# Patient Record
Sex: Male | Born: 1960 | Race: White | Hispanic: No | Marital: Single | Smoking: Never smoker
Health system: Southern US, Community
[De-identification: ages and names within clinical notes are randomized; demographics above are authoritative.]

## PROBLEM LIST (undated history)

## (undated) DIAGNOSIS — K635 Polyp of colon: Secondary | ICD-10-CM

## (undated) DIAGNOSIS — Z8719 Personal history of other diseases of the digestive system: Secondary | ICD-10-CM

## (undated) HISTORY — DX: Polyp of colon: K63.5

---
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
## 2005-11-13 ENCOUNTER — Encounter: Admission: RE | Admit: 2005-11-13 | Discharge: 2005-11-13 | Payer: Self-pay | Admitting: Family Medicine

## 2010-12-18 HISTORY — PX: COLON SURGERY: SHX602

## 2011-12-19 DIAGNOSIS — Z8719 Personal history of other diseases of the digestive system: Secondary | ICD-10-CM

## 2011-12-19 HISTORY — DX: Personal history of other diseases of the digestive system: Z87.19

## 2011-12-21 ENCOUNTER — Ambulatory Visit (INDEPENDENT_AMBULATORY_CARE_PROVIDER_SITE_OTHER): Payer: 59 | Admitting: General Surgery

## 2011-12-21 ENCOUNTER — Encounter (INDEPENDENT_AMBULATORY_CARE_PROVIDER_SITE_OTHER): Payer: Self-pay | Admitting: General Surgery

## 2011-12-21 VITALS — BP 134/78 | HR 72 | Temp 98.2°F | Resp 18 | Ht 76.0 in | Wt 356.6 lb

## 2011-12-21 DIAGNOSIS — K635 Polyp of colon: Secondary | ICD-10-CM

## 2011-12-21 DIAGNOSIS — D126 Benign neoplasm of colon, unspecified: Secondary | ICD-10-CM

## 2011-12-21 NOTE — Progress Notes (Signed)
Patient ID: Larry Mcclure, male   DOB: 03/24/1961, 51 y.o.   MRN: [id number]  Chief Complaint  Patient presents with  . Other    right colon polyp    HPI Larry Mcclure is a 51 y.o. male.  Referred by Dr. Doy Mince HPI 66 yom who underwent first screening colonoscopy this year with Dr. Bosie Clos.  He had no complaints prior to this.  He denies BRB, change in stool habits or caliber, ab pain or weight loss.  His colonoscopy showed a 30 mm polypoid, multi-lobulated lesion in proximal ascending colon.  It was injected with [place] ink.  The remainder of his colonoscopy showed a sessile polyp in the descending colon.  Pathology showed fragments of adenomatous colonic mucosa in the ascending colon as well as tubular adenoma in descending colon.  He is referred for removal of ascending colon polyp.  Past Medical History  Diagnosis Date  . Colon polyp     Past Surgical History  Procedure Date  . Tonsillectomy 1973    Family History  Problem Relation Age of Onset  . COPD Mother     Social History History  Substance Use Topics  . Smoking status: Never Smoker   . Smokeless tobacco: Never Used  . Alcohol Use: Yes     once per month    Allergies  Allergen Reactions  . Penicillins Rash    Happened in childhood    Current Outpatient Prescriptions  Medication Sig Dispense Refill  . Diphenhydramine-APAP, sleep, (GOODYS PM PO) Take by mouth as needed.          Review of Systems Review of Systems  Constitutional: Negative for fever, chills and unexpected weight change.  HENT: Negative for hearing loss, congestion, sore throat, trouble swallowing and voice change.   Eyes: Negative for visual disturbance.  Respiratory: Negative for cough and wheezing.   Cardiovascular: Negative for chest pain, palpitations and leg swelling.  Gastrointestinal: Negative for nausea, vomiting, abdominal pain, diarrhea, constipation, blood in stool, abdominal distention, anal bleeding and rectal pain.    Genitourinary: Negative for hematuria and difficulty urinating.  Musculoskeletal: Negative for arthralgias.  Skin: Negative for rash and wound.  Neurological: Negative for seizures, syncope, weakness and headaches.  Hematological: Negative for adenopathy. Does not bruise/bleed easily.  Psychiatric/Behavioral: Negative for confusion.    Blood pressure 134/78, pulse 72, temperature 98.2 F (36.8 C), temperature source Temporal, resp. rate 18, height 6\' 4"  (1.93 m), weight 356 lb 9.6 oz (161.753 kg).  Physical Exam Physical Exam  Constitutional: He appears well-developed and well-nourished.  Eyes: No scleral icterus.  Neck: Neck supple.  Cardiovascular: Normal rate, regular rhythm and normal heart sounds.   Pulmonary/Chest: Effort normal and breath sounds normal. He has no wheezes. He has no rales.  Abdominal: Soft. Bowel sounds are normal. He exhibits no mass. There is no tenderness.       Obese   Lymphadenopathy:    He has no cervical adenopathy.    Data Reviewed Colonoscopy and pathology report reviewed  Assessment    RIght colon adenomatous polyp unable to be excised endoscopically    Plan    We discussed pathology report and indications for surgery.  Indications include excising a precancerous lesion as well as ensuring there is no cancer present in larger polyp that remains.  We discussed a laparoscopic right colectomy.  We went over pathophysiology of colon polyps and colon cancer.  Risks of surgery include but are not limited to bleeding, infection, abscess requiring  drainage, anastomotic leakage requiring reoperation, DVT, PE, PNA, UTI.  Will plan on bowel prep preop, sq heparin, entereg perioperatively.  He would like to do this end of February and I think that is reasonable.  We discussed 2 months out of work given his job.  I will see him back a week before surgery to ensure no changes.        Larry Mcclure 12/21/2011, 9:30 PM

## 2011-12-26 ENCOUNTER — Encounter (INDEPENDENT_AMBULATORY_CARE_PROVIDER_SITE_OTHER): Payer: Self-pay | Admitting: Gastroenterology

## 2011-12-26 ENCOUNTER — Encounter (INDEPENDENT_AMBULATORY_CARE_PROVIDER_SITE_OTHER): Payer: Self-pay

## 2012-02-06 ENCOUNTER — Encounter (HOSPITAL_COMMUNITY): Payer: Self-pay | Admitting: Pharmacy Technician

## 2012-02-07 ENCOUNTER — Encounter (HOSPITAL_COMMUNITY)
Admission: RE | Admit: 2012-02-07 | Discharge: 2012-02-07 | Disposition: A | Discharge status: Home / Self Care | Payer: 59 | Source: Ambulatory Visit | Attending: General Surgery | Admitting: General Surgery

## 2012-02-07 ENCOUNTER — Other Ambulatory Visit: Payer: Self-pay

## 2012-02-07 ENCOUNTER — Ambulatory Visit (HOSPITAL_COMMUNITY)
Admission: RE | Admit: 2012-02-07 | Discharge: 2012-02-07 | Disposition: A | Discharge status: Home / Self Care | Payer: 59 | Source: Ambulatory Visit | Attending: General Surgery | Admitting: General Surgery

## 2012-02-07 ENCOUNTER — Encounter (HOSPITAL_COMMUNITY): Payer: Self-pay

## 2012-02-07 DIAGNOSIS — Z01818 Encounter for other preprocedural examination: Secondary | ICD-10-CM | POA: Insufficient documentation

## 2012-02-07 DIAGNOSIS — K639 Disease of intestine, unspecified: Secondary | ICD-10-CM | POA: Insufficient documentation

## 2012-02-07 DIAGNOSIS — Z01812 Encounter for preprocedural laboratory examination: Secondary | ICD-10-CM | POA: Insufficient documentation

## 2012-02-07 LAB — COMPREHENSIVE METABOLIC PANEL
ALT: 21 U/L (ref 0–53)
AST: 29 U/L (ref 0–37)
Albumin: 3.5 g/dL (ref 3.5–5.2)
CO2: 26 mEq/L (ref 19–32)
Chloride: 106 mEq/L (ref 96–112)
Creatinine, Ser: 1.04 mg/dL (ref 0.50–1.35)
GFR calc non Af Amer: 81 mL/min — ABNORMAL LOW (ref >90)
Sodium: 139 mEq/L (ref 135–145)
Total Bilirubin: 0.6 mg/dL (ref 0.3–1.2)

## 2012-02-07 LAB — CBC
Platelets: 163 10*3/uL (ref 150–400)
RBC: 5.23 MIL/uL (ref 4.22–5.81)
RDW: 14.3 % (ref 11.5–15.5)
WBC: 6.8 10*3/uL (ref 4.0–10.5)

## 2012-02-07 NOTE — Patient Instructions (Signed)
20 Larry Mcclure  02/07/2012   Your procedure is scheduled on: 02-14-12  Report to [facility] at     0630   AM.  Call this number if you have problems the morning of surgery: [phone]   Remember:   Do not eat food:After Midnight. Follow bowel prep instructions per office.    Take these medicines the morning of surgery with A SIP OF WATER: none.   Do not wear jewelry, make-up or nail polish.  Do not wear lotions, powders, or perfumes. You may wear deodorant.  Do not shave 48 hours prior to surgery.(may shave neck and face)  Do not bring valuables to the hospital.  Contacts, dentures or bridgework may not be worn into surgery.  Leave suitcase in the car. After surgery it may be brought to your room.  For patients admitted to the hospital, checkout time is 11:00 AM the day of discharge.   Patients discharged the day of surgery will not be allowed to drive home.  Name and phone number of your driver: spouse  Special Instructions: CHG Shower Use Special Wash: 1/2 bottle night before surgery and 1/2 bottle morning of surgery.(Avoid face)   Please read over the following fact sheets that you were given: MRSA Information, blood transfusion fact sheet.

## 2012-02-07 NOTE — Pre-Procedure Instructions (Signed)
02-07-12 EKG/ CXR done today.

## 2012-02-08 ENCOUNTER — Encounter (INDEPENDENT_AMBULATORY_CARE_PROVIDER_SITE_OTHER): Payer: 59 | Admitting: General Surgery

## 2012-02-12 ENCOUNTER — Ambulatory Visit (INDEPENDENT_AMBULATORY_CARE_PROVIDER_SITE_OTHER): Payer: 59 | Admitting: General Surgery

## 2012-02-12 ENCOUNTER — Encounter (INDEPENDENT_AMBULATORY_CARE_PROVIDER_SITE_OTHER): Payer: Self-pay | Admitting: General Surgery

## 2012-02-12 VITALS — BP 144/88 | HR 72 | Temp 97.8°F | Resp 16 | Ht 76.0 in | Wt 357.8 lb

## 2012-02-12 DIAGNOSIS — D126 Benign neoplasm of colon, unspecified: Secondary | ICD-10-CM

## 2012-02-12 DIAGNOSIS — K635 Polyp of colon: Secondary | ICD-10-CM

## 2012-02-12 MED ORDER — PEG 3350-KCL-NABCB-NACL-NASULF 236 G PO SOLR: 4.0000 L | Freq: Once | ORAL | Status: AC

## 2012-02-12 NOTE — Progress Notes (Signed)
Patient ID: Larry Mcclure, male   DOB: 1961/08/02, 52 y.o.   MRN: [id number]  Chief Complaint  Patient presents with  . Pre-op Exam    reck before t colectomy 02/14/12    HPI Larry Mcclure is a 51 y.o. male.   HPI This is a 51 year old male who I saw motor to ago after undergoing a screening colonoscopy with a proximal descending colon polyp. This is an adenomatous polyp. He was referred for surgery as this was not able to be resected endoscopically. He wanted to do this this week. I didn't come back now just to reexamine him prior to surgery. He reports no interval change .  Past Medical History  Diagnosis Date  . Colon polyp     Past Surgical History  Procedure Date  . Tonsillectomy 1973    Family History  Problem Relation Age of Onset  . COPD Mother   . Heart disease Mother   . Heart disease Father   . Cancer Sister     GI tumor    Social History History  Substance Use Topics  . Smoking status: Never Smoker   . Smokeless tobacco: Never Used  . Alcohol Use: Yes     once per month    Allergies  Allergen Reactions  . Penicillins Rash    Happened in childhood    Current Outpatient Prescriptions  Medication Sig Dispense Refill  . Aspirin-Acetaminophen-Caffeine (GOODYS EXTRA STRENGTH) 500-325-65 MG PACK Take 1 Package by mouth every 4 (four) hours as needed. Pain        Review of Systems Review of Systems  Constitutional: Negative for fever, chills and unexpected weight change.  HENT: Negative for hearing loss, congestion, sore throat, trouble swallowing and voice change.   Eyes: Negative for visual disturbance.  Respiratory: Negative for cough and wheezing.   Cardiovascular: Negative for chest pain, palpitations and leg swelling.  Gastrointestinal: Negative for nausea, vomiting, abdominal pain, diarrhea, constipation, blood in stool, abdominal distention, anal bleeding and rectal pain.  Genitourinary: Negative for hematuria and difficulty urinating.    Musculoskeletal: Negative for arthralgias.  Skin: Negative for rash and wound.  Neurological: Negative for seizures, syncope, weakness and headaches.  Hematological: Negative for adenopathy. Does not bruise/bleed easily.  Psychiatric/Behavioral: Negative for confusion.    Blood pressure 144/88, pulse 72, temperature 97.8 F (36.6 C), temperature source Temporal, resp. rate 16, height 6\' 4"  (1.93 m), weight 357 lb 12.8 oz (162.297 kg).  Physical Exam Physical Exam  Vitals reviewed. Constitutional: He appears well-developed and well-nourished.  Cardiovascular: Normal rate, regular rhythm and normal heart sounds.   Pulmonary/Chest: Effort normal and breath sounds normal. He has no wheezes. He has no rales.  Abdominal: Soft. Bowel sounds are normal. He exhibits no mass. There is no tenderness.      Assessment    Right colon polyp    Plan    We again discussed a laparoscopic possible open right colectomy. We discussed again the risks and benefits associated with surgery. We'll plan on proceeding Wednesday as scheduled.       Chloe Baig 02/12/2012, 10:21 AM

## 2012-02-14 ENCOUNTER — Encounter (HOSPITAL_COMMUNITY)
Admission: RE | Disposition: A | Discharge status: Home Health | Payer: Self-pay | Source: Ambulatory Visit | Attending: General Surgery

## 2012-02-14 ENCOUNTER — Inpatient Hospital Stay (HOSPITAL_COMMUNITY)
Admission: RE | Admit: 2012-02-14 | Discharge: 2012-02-18 | DRG: 330 | Disposition: A | Discharge status: Home Health | Payer: 59 | Source: Ambulatory Visit | Attending: General Surgery | Admitting: General Surgery

## 2012-02-14 ENCOUNTER — Encounter (HOSPITAL_COMMUNITY): Payer: Self-pay | Admitting: Anesthesiology

## 2012-02-14 ENCOUNTER — Encounter (HOSPITAL_COMMUNITY): Payer: Self-pay | Admitting: *Deleted

## 2012-02-14 ENCOUNTER — Ambulatory Visit (HOSPITAL_COMMUNITY): Payer: 59 | Admitting: Anesthesiology

## 2012-02-14 DIAGNOSIS — D378 Neoplasm of uncertain behavior of other specified digestive organs: Secondary | ICD-10-CM

## 2012-02-14 DIAGNOSIS — K6389 Other specified diseases of intestine: Secondary | ICD-10-CM

## 2012-02-14 DIAGNOSIS — D126 Benign neoplasm of colon, unspecified: Principal | ICD-10-CM | POA: Diagnosis present

## 2012-02-14 DIAGNOSIS — D371 Neoplasm of uncertain behavior of stomach: Secondary | ICD-10-CM

## 2012-02-14 DIAGNOSIS — K560 Paralytic ileus: Secondary | ICD-10-CM | POA: Diagnosis not present

## 2012-02-14 DIAGNOSIS — D375 Neoplasm of uncertain behavior of rectum: Secondary | ICD-10-CM

## 2012-02-14 LAB — TYPE AND SCREEN: Antibody Screen: NEGATIVE

## 2012-02-14 SURGERY — COLECTOMY, RIGHT, LAPAROSCOPIC
Anesthesia: General | Site: Abdomen | Laterality: Right | Wound class: Contaminated

## 2012-02-14 MED ORDER — MORPHINE SULFATE (PF) 1 MG/ML IV SOLN
INTRAVENOUS | Status: AC
  Filled 2012-02-14: qty 25

## 2012-02-14 MED ORDER — SODIUM CHLORIDE 0.9 % IV SOLN
INTRAVENOUS | Status: DC
  Administered 2012-02-14: 12:00:00 via INTRAVENOUS

## 2012-02-14 MED ORDER — NEOSTIGMINE METHYLSULFATE 1 MG/ML IJ SOLN
INTRAMUSCULAR | Status: DC | PRN
  Administered 2012-02-14: 4 mg via INTRAVENOUS

## 2012-02-14 MED ORDER — ROCURONIUM BROMIDE 100 MG/10ML IV SOLN
INTRAVENOUS | Status: DC | PRN
  Administered 2012-02-14: 50 mg via INTRAVENOUS
  Administered 2012-02-14 (×2): 10 mg via INTRAVENOUS
  Administered 2012-02-14: 20 mg via INTRAVENOUS
  Administered 2012-02-14 (×2): 10 mg via INTRAVENOUS

## 2012-02-14 MED ORDER — ACETAMINOPHEN 10 MG/ML IV SOLN
INTRAVENOUS | Status: DC | PRN
  Administered 2012-02-14: 1000 mg via INTRAVENOUS

## 2012-02-14 MED ORDER — PROMETHAZINE HCL 25 MG/ML IJ SOLN: 6.2500 mg | INTRAMUSCULAR | Status: DC | PRN

## 2012-02-14 MED ORDER — SUCCINYLCHOLINE CHLORIDE 20 MG/ML IJ SOLN
INTRAMUSCULAR | Status: DC | PRN
  Administered 2012-02-14: 200 mg via INTRAVENOUS

## 2012-02-14 MED ORDER — KETOROLAC TROMETHAMINE 30 MG/ML IJ SOLN
INTRAMUSCULAR | Status: AC
  Filled 2012-02-14: qty 1

## 2012-02-14 MED ORDER — SODIUM CHLORIDE 0.9 % IV SOLN
INTRAVENOUS | Status: AC
  Filled 2012-02-14: qty 1

## 2012-02-14 MED ORDER — EPINEPHRINE HCL 0.1 MG/ML IJ SOLN
INTRAMUSCULAR | Status: AC
  Filled 2012-02-14: qty 10

## 2012-02-14 MED ORDER — ALVIMOPAN 12 MG PO CAPS
12.0000 mg | ORAL_CAPSULE | Freq: Once | ORAL | Status: AC
  Administered 2012-02-14: 12 mg via ORAL

## 2012-02-14 MED ORDER — SODIUM CHLORIDE 0.9 % IV SOLN
INTRAVENOUS | Status: DC
  Administered 2012-02-14 – 2012-02-15 (×2): 110 mL via INTRAVENOUS

## 2012-02-14 MED ORDER — ONDANSETRON HCL 4 MG/2ML IJ SOLN: 4.0000 mg | Freq: Four times a day (QID) | INTRAMUSCULAR | Status: DC | PRN

## 2012-02-14 MED ORDER — ONDANSETRON HCL 4 MG/2ML IJ SOLN
INTRAMUSCULAR | Status: DC | PRN
  Administered 2012-02-14: 4 mg via INTRAVENOUS

## 2012-02-14 MED ORDER — PANTOPRAZOLE SODIUM 40 MG IV SOLR
40.0000 mg | Freq: Every day | INTRAVENOUS | Status: DC
  Administered 2012-02-14 – 2012-02-16 (×3): 40 mg via INTRAVENOUS
  Filled 2012-02-14 (×5): qty 40

## 2012-02-14 MED ORDER — NALOXONE HCL 0.4 MG/ML IJ SOLN: 0.4000 mg | INTRAMUSCULAR | Status: DC | PRN

## 2012-02-14 MED ORDER — LACTATED RINGERS IR SOLN
Status: DC | PRN
  Administered 2012-02-14: 1000 mL

## 2012-02-14 MED ORDER — LIDOCAINE HCL (CARDIAC) 20 MG/ML IV SOLN
INTRAVENOUS | Status: DC | PRN
  Administered 2012-02-14: 50 mg via INTRAVENOUS

## 2012-02-14 MED ORDER — HYDROMORPHONE HCL PF 1 MG/ML IJ SOLN: 0.2500 mg | INTRAMUSCULAR | Status: DC | PRN

## 2012-02-14 MED ORDER — HYDROMORPHONE HCL PF 1 MG/ML IJ SOLN
INTRAMUSCULAR | Status: DC | PRN
  Administered 2012-02-14: 1 mg via INTRAVENOUS
  Administered 2012-02-14 (×2): 0.5 mg via INTRAVENOUS

## 2012-02-14 MED ORDER — ACETAMINOPHEN 650 MG RE SUPP
650.0000 mg | Freq: Four times a day (QID) | RECTAL | Status: DC | PRN
  Administered 2012-02-15: 650 mg via RECTAL
  Filled 2012-02-14: qty 1

## 2012-02-14 MED ORDER — MIDAZOLAM HCL 5 MG/5ML IJ SOLN
INTRAMUSCULAR | Status: DC | PRN
  Administered 2012-02-14: 2 mg via INTRAVENOUS

## 2012-02-14 MED ORDER — SODIUM CHLORIDE 0.9 % IJ SOLN: 9.0000 mL | INTRAMUSCULAR | Status: DC | PRN

## 2012-02-14 MED ORDER — DIPHENHYDRAMINE HCL 12.5 MG/5ML PO ELIX: 12.5000 mg | ORAL_SOLUTION | Freq: Four times a day (QID) | ORAL | Status: DC | PRN

## 2012-02-14 MED ORDER — HEPARIN SODIUM (PORCINE) 5000 UNIT/ML IJ SOLN
5000.0000 [IU] | Freq: Once | INTRAMUSCULAR | Status: AC
  Administered 2012-02-14: 5000 [IU] via SUBCUTANEOUS

## 2012-02-14 MED ORDER — ACETAMINOPHEN 325 MG PO TABS: 650.0000 mg | ORAL_TABLET | Freq: Four times a day (QID) | ORAL | Status: DC | PRN

## 2012-02-14 MED ORDER — ALVIMOPAN 12 MG PO CAPS: 12.0000 mg | ORAL_CAPSULE | Freq: Once | ORAL | Status: DC

## 2012-02-14 MED ORDER — BUPIVACAINE HCL (PF) 0.25 % IJ SOLN
INTRAMUSCULAR | Status: AC
  Filled 2012-02-14: qty 30

## 2012-02-14 MED ORDER — FENTANYL CITRATE 0.05 MG/ML IJ SOLN
INTRAMUSCULAR | Status: DC | PRN
  Administered 2012-02-14 (×2): 50 ug via INTRAVENOUS
  Administered 2012-02-14: 100 ug via INTRAVENOUS
  Administered 2012-02-14 (×6): 50 ug via INTRAVENOUS

## 2012-02-14 MED ORDER — PROPOFOL 10 MG/ML IV BOLUS
INTRAVENOUS | Status: DC | PRN
  Administered 2012-02-14: 200 mg via INTRAVENOUS

## 2012-02-14 MED ORDER — LACTATED RINGERS IV SOLN
INTRAVENOUS | Status: DC
  Administered 2012-02-14: 09:00:00 via INTRAVENOUS
  Administered 2012-02-14: 1000 mL via INTRAVENOUS

## 2012-02-14 MED ORDER — DIPHENHYDRAMINE HCL 50 MG/ML IJ SOLN: 12.5000 mg | Freq: Four times a day (QID) | INTRAMUSCULAR | Status: DC | PRN

## 2012-02-14 MED ORDER — KETOROLAC TROMETHAMINE 30 MG/ML IJ SOLN
15.0000 mg | Freq: Once | INTRAMUSCULAR | Status: AC | PRN
  Administered 2012-02-14: 30 mg via INTRAVENOUS

## 2012-02-14 MED ORDER — HEPARIN SODIUM (PORCINE) 5000 UNIT/ML IJ SOLN
5000.0000 [IU] | Freq: Three times a day (TID) | INTRAMUSCULAR | Status: DC
  Administered 2012-02-15 – 2012-02-18 (×10): 5000 [IU] via SUBCUTANEOUS
  Filled 2012-02-14 (×13): qty 1

## 2012-02-14 MED ORDER — 0.9 % SODIUM CHLORIDE (POUR BTL) OPTIME
TOPICAL | Status: DC | PRN
  Administered 2012-02-14: 2000 mL

## 2012-02-14 MED ORDER — SODIUM CHLORIDE 0.9 % IV SOLN
1.0000 g | INTRAVENOUS | Status: AC
  Administered 2012-02-14: 1 g via INTRAVENOUS

## 2012-02-14 MED ORDER — BUPIVACAINE HCL (PF) 0.25 % IJ SOLN
INTRAMUSCULAR | Status: DC | PRN
  Administered 2012-02-14: 20 mL

## 2012-02-14 MED ORDER — STERILE WATER FOR IRRIGATION IR SOLN
Status: DC | PRN
  Administered 2012-02-14: 1500 mL

## 2012-02-14 MED ORDER — MORPHINE SULFATE (PF) 1 MG/ML IV SOLN
INTRAVENOUS | Status: DC
  Administered 2012-02-14: 1.5 mg via INTRAVENOUS
  Administered 2012-02-14: 1 mg via INTRAVENOUS
  Administered 2012-02-14: 9 mg via INTRAVENOUS
  Administered 2012-02-15: 1.5 mg via INTRAVENOUS
  Administered 2012-02-15: 6 mg via INTRAVENOUS
  Administered 2012-02-15: 3 mg via INTRAVENOUS
  Administered 2012-02-15: 9 mg via INTRAVENOUS
  Administered 2012-02-15: 13.5 mg via INTRAVENOUS
  Administered 2012-02-15: 3 mg via INTRAVENOUS
  Administered 2012-02-15: 01:00:00 via INTRAVENOUS
  Administered 2012-02-16: 1.5 mg via INTRAVENOUS
  Administered 2012-02-16: 3 mg via INTRAVENOUS
  Filled 2012-02-14 (×2): qty 25

## 2012-02-14 MED ORDER — GLYCOPYRROLATE 0.2 MG/ML IJ SOLN
INTRAMUSCULAR | Status: DC | PRN
  Administered 2012-02-14: .7 mg via INTRAVENOUS

## 2012-02-14 MED ORDER — BUPIVACAINE-EPINEPHRINE PF 0.25-1:200000 % IJ SOLN
INTRAMUSCULAR | Status: AC
  Filled 2012-02-14: qty 30

## 2012-02-14 MED ORDER — ACETAMINOPHEN 10 MG/ML IV SOLN
INTRAVENOUS | Status: AC
  Filled 2012-02-14: qty 100

## 2012-02-14 SURGICAL SUPPLY — 80 items
ADH SKN CLS APL DERMABOND .7 (GAUZE/BANDAGES/DRESSINGS)
APPLIER CLIP 5 13 M/L LIGAMAX5 (MISCELLANEOUS)
APPLIER CLIP ROT 10 11.4 M/L (STAPLE)
APR CLP MED LRG 11.4X10 (STAPLE)
APR CLP MED LRG 5 ANG JAW (MISCELLANEOUS)
BAG SPEC RTRVL LRG 6X4 10 (ENDOMECHANICALS)
BANDAID FLEXIBLE 1X3 (GAUZE/BANDAGES/DRESSINGS) ×2 IMPLANT
BLADE EXTENDED COATED 6.5IN (ELECTRODE) ×2 IMPLANT
BLADE HEX COATED 2.75 (ELECTRODE) ×3 IMPLANT
BLADE SURG SZ10 CARB STEEL (BLADE) ×6 IMPLANT
CANISTER SUCTION 2500CC (MISCELLANEOUS) ×3 IMPLANT
CANNULA ENDOPATH XCEL 11M (ENDOMECHANICALS) IMPLANT
CELLS DAT CNTRL 66122 CELL SVR (MISCELLANEOUS) IMPLANT
CHLORAPREP W/TINT 26ML (MISCELLANEOUS) ×3 IMPLANT
CLIP APPLIE 5 13 M/L LIGAMAX5 (MISCELLANEOUS) IMPLANT
CLIP APPLIE ROT 10 11.4 M/L (STAPLE) IMPLANT
CLOTH BEACON ORANGE TIMEOUT ST (SAFETY) ×3 IMPLANT
COVER MAYO STAND STRL (DRAPES) ×3 IMPLANT
DECANTER SPIKE VIAL GLASS SM (MISCELLANEOUS) ×3 IMPLANT
DERMABOND ADVANCED (GAUZE/BANDAGES/DRESSINGS)
DERMABOND ADVANCED .7 DNX12 (GAUZE/BANDAGES/DRESSINGS) IMPLANT
DRAPE LAPAROSCOPIC ABDOMINAL (DRAPES) ×3 IMPLANT
DRAPE LG THREE QUARTER DISP (DRAPES) IMPLANT
DRAPE WARM FLUID 44X44 (DRAPE) ×6 IMPLANT
DRESSING TELFA 8X3 (GAUZE/BANDAGES/DRESSINGS) ×2 IMPLANT
DRESSING TELFA ISLAND 4X8 (GAUZE/BANDAGES/DRESSINGS) ×2 IMPLANT
ELECT REM PT RETURN 9FT ADLT (ELECTROSURGICAL) ×3
ELECTRODE REM PT RTRN 9FT ADLT (ELECTROSURGICAL) ×2 IMPLANT
FILTER SMOKE EVAC LAPAROSHD (FILTER) IMPLANT
GLOVE BIOGEL M 7.0 STRL (GLOVE) ×6 IMPLANT
GLOVE BIOGEL PI IND STRL 7.0 (GLOVE) ×2 IMPLANT
GLOVE BIOGEL PI INDICATOR 7.0 (GLOVE) ×1
GOWN PREVENTION PLUS LG XLONG (DISPOSABLE) ×3 IMPLANT
GOWN PREVENTION PLUS XLARGE (GOWN DISPOSABLE) ×3 IMPLANT
GOWN STRL NON-REIN LRG LVL3 (GOWN DISPOSABLE) ×3 IMPLANT
GOWN STRL REIN XL XLG (GOWN DISPOSABLE) ×3 IMPLANT
KIT BASIN OR (CUSTOM PROCEDURE TRAY) ×3 IMPLANT
LEGGING LITHOTOMY PAIR STRL (DRAPES) IMPLANT
LIGASURE IMPACT 36 18CM CVD LR (INSTRUMENTS) ×3 IMPLANT
NS IRRIG 1000ML POUR BTL (IV SOLUTION) ×3 IMPLANT
PENCIL BUTTON HOLSTER BLD 10FT (ELECTRODE) ×3 IMPLANT
POUCH SPECIMEN RETRIEVAL 10MM (ENDOMECHANICALS) IMPLANT
RELOAD PROXIMATE 75MM BLUE (ENDOMECHANICALS) ×9 IMPLANT
RELOAD STAPLE 75 3.8 BLU REG (ENDOMECHANICALS) IMPLANT
RETRACTOR WND ALEXIS 18 MED (MISCELLANEOUS) IMPLANT
RTRCTR WOUND ALEXIS 18CM MED (MISCELLANEOUS)
SCALPEL HARMONIC ACE (MISCELLANEOUS) IMPLANT
SET IRRIG TUBING LAPAROSCOPIC (IRRIGATION / IRRIGATOR) ×2 IMPLANT
SLEEVE XCEL OPT CAN 5 100 (ENDOMECHANICALS) ×2 IMPLANT
SOLUTION ANTI FOG 6CC (MISCELLANEOUS) ×3 IMPLANT
SPONGE GAUZE 4X4 12PLY (GAUZE/BANDAGES/DRESSINGS) ×3 IMPLANT
SPONGE LAP 18X18 X RAY DECT (DISPOSABLE) ×6 IMPLANT
STAPLER GUN LINEAR PROX 60 (STAPLE) ×2 IMPLANT
STAPLER PROXIMATE 75MM BLUE (STAPLE) ×2 IMPLANT
STAPLER VISISTAT 35W (STAPLE) ×3 IMPLANT
STRIP CLOSURE SKIN 1/2X4 (GAUZE/BANDAGES/DRESSINGS) IMPLANT
SUCTION POOLE TIP (SUCTIONS) ×3 IMPLANT
SUT PDS AB 1 TP1 96 (SUTURE) ×6 IMPLANT
SUT PROLENE 2 0 KS (SUTURE) IMPLANT
SUT PROLENE 2 0 SH DA (SUTURE) IMPLANT
SUT SILK 2 0 (SUTURE) ×3
SUT SILK 2 0 SH CR/8 (SUTURE) ×2 IMPLANT
SUT SILK 2-0 18XBRD TIE 12 (SUTURE) ×2 IMPLANT
SUT SILK 3 0 (SUTURE) ×3
SUT SILK 3 0 SH CR/8 (SUTURE) ×2 IMPLANT
SUT SILK 3-0 18XBRD TIE 12 (SUTURE) ×1 IMPLANT
SYR BULB IRRIGATION 50ML (SYRINGE) ×3 IMPLANT
SYS LAPSCP GELPORT 120MM (MISCELLANEOUS)
SYSTEM LAPSCP GELPORT 120MM (MISCELLANEOUS) IMPLANT
TOWEL OR 17X26 10 PK STRL BLUE (TOWEL DISPOSABLE) ×3 IMPLANT
TRAY FOLEY CATH 14FRSI W/METER (CATHETERS) ×3 IMPLANT
TRAY LAP CHOLE (CUSTOM PROCEDURE TRAY) ×3 IMPLANT
TROCAR BLADELESS OPT 5 100 (ENDOMECHANICALS) ×2 IMPLANT
TROCAR BLADELESS OPT 5 75 (ENDOMECHANICALS) ×6 IMPLANT
TROCAR XCEL 12X100 BLDLESS (ENDOMECHANICALS) IMPLANT
TROCAR XCEL BLUNT TIP 100MML (ENDOMECHANICALS) IMPLANT
TROCAR XCEL NON-BLD 11X100MML (ENDOMECHANICALS) IMPLANT
TUBING FILTER THERMOFLATOR (ELECTROSURGICAL) ×3 IMPLANT
YANKAUER SUCT BULB TIP 10FT TU (MISCELLANEOUS) ×3 IMPLANT
YANKAUER SUCT BULB TIP NO VENT (SUCTIONS) ×3 IMPLANT

## 2012-02-14 NOTE — Progress Notes (Signed)
Pt did bowel prep  As instructed with good results. Pt has a stopped up nose with nasal drainage this am. Temp 99.2 Lungs clear

## 2012-02-14 NOTE — Anesthesia Postprocedure Evaluation (Signed)
  Anesthesia Post-op Note  Patient: Larry Mcclure  Procedure(s) Performed: Procedure(s) (LRB): LAPAROSCOPIC RIGHT COLECTOMY (Right)  Patient Location: PACU  Anesthesia Type: General  Level of Consciousness: awake and alert   Airway and Oxygen Therapy: Patient Spontanous Breathing  Post-op Pain: mild  Post-op Assessment: Post-op Vital signs reviewed, Patient's Cardiovascular Status Stable, Respiratory Function Stable, Patent Airway and No signs of Nausea or vomiting  Post-op Vital Signs: stable  Complications: No apparent anesthesia complications

## 2012-02-14 NOTE — Op Note (Signed)
Preoperative diagnosis: Right colon polyp unresectable by colonoscopy Postoperative diagnosis: Same as above Procedure: Laparoscopic-assisted right colectomy Surgeon: Dr. Harden Mo Asst.: Dr. Chevis Pretty Anesthesia: Gen. Estimated blood loss: 100 cc Drains: None Complications: None Specimens: #1 right colon which is confirmed by pathology to contain the polyp #2 additional small bowel  Sponge needle count correct x2 Disposition patient to recovery in stable condition  Indications: This is a 51 year old male who underwent routine screening colonoscopy with the finding of a 30 mm polypoid lesion in his right colon. This was biopsied and was benign. This was tattooed. This was not able to be resected endoscopically and he was referred for excision. He and I discussed a right hemicolectomy with the risks and benefits associated with that. He wanted to delay this for a couple of months due to work.  Procedure: He underwent a bowel preparation at home. He was then brought to the operating room. He was administered 1 g of intravenous ertapenem. Sequential compression devices were placed on his legs. He was administered 5000 units of subcutaneous heparin. He was then taken to the operating room and placed under general anesthesia without complication. An orogastric tube and Foley catheter were placed. His right arm was tucked appropriately padded. His left arm was left out. His abdomen was prepped and draped in the standard sterile surgical fashion. Surgical timeout was then performed.  The stomach was evacuated. I then infiltrated .25 percent Marcaine in his left upper quadrant. I made an incision with an 11 blade. I then entered his peritoneum with a 5 mm Optiview trocar. This was done without injury and without any difficulty. His abdomen was insufflated to 15 mmHg pressure. I then inserted 3 further 5 mm trocars under direct vision after infiltration with local anesthetic without complication in  the right lower quadrant and the lower abdomen. It was very difficult initially to identify his colon as he had a large amount of intra-abdominal fat. Eventually I was able to identify the colon as well as his appendix. The terminal ileum was noted to be very adherent to his sidewall. I identified his tattoo very near cecum. I then incised the white line of Toldt with the harmonic scalpel in its entirety and medialize the colon all the way up to the hepatic flexure. I also incised the attachments of the terminal ileum to the pelvic sidewall as well as release the appendix. This appeared to be free upon completion and rolled medially very easily. I then approached the transverse colon. His gallbladder had a lot of adhesions to his colon. It took me about 45 minutes to release the transverse colon from the gallbladder and completely free up the hepatic flexure. This again was done with a harmonic scalpel. The duodenum was visualized and was not injured during this procedure either. Eventually I thought I had this up enough to pull out through extraction port. I then made an 8 cm incision due to his body habitus and placed a hand port. It appeared at this point I was able to pull the entire colon up. His terminal ileum still appeared to be stuck I could not identify where exactly this was. The end of his terminal ileum after I divided with the GIA was a little bit bruised as well. I did divide this with a GIA. I then came across his colonic mesentery with the LigaSure device using 2-0 silk sutures to oversew all of the vessels. I chose a point in his transverse colon just past the  hepatic flexure and divided the colon. This was then passed off the table as a specimen. Due to the fact that I was really not able to pull the ileum up without some tension and I was concerned about the appearance of this bowel I did extend my incision a little bit below his umbilicus. I was able to then released completely his terminal  ileum and I used the GIA stapler to resect about another 6 cm segment of small bowel. The remaining small bowel was not tender at all it appeared very healthy. I then attached this to the transverse colon and under no tension with 3-0 silk sutures. Enterotomies were made in both Endo GIA stapler was used to create a common enterotomy. This was hemostatic. I then used a TA stapler to close the common enterotomy. I oversewed this with 3-0 silk. I placed 2 3-0 silk crotch stitches as well. The anastomosis was without tension and healthy. I did not close the mesenteric defect as it was very wide and was very difficult to do due to the amount of fat that he had present. Hemostasis was present. I then placed on his back in the abdomen. These trocars had been removed. I closed its fascia with #1 looped PDS. I then irrigated this wound copiously. I stapled it  over Telfa wicks. I stapled his laparoscopic incisions. Sterile dressings were placed on all of these. He tolerated this well was transferred to recovery in stable condition.

## 2012-02-14 NOTE — Transfer of Care (Signed)
Immediate Anesthesia Transfer of Care Note  Patient: Larry Mcclure  Procedure(s) Performed: Procedure(s) (LRB): LAPAROSCOPIC RIGHT COLECTOMY (Right)  Patient Location: PACU  Anesthesia Type: General  Level of Consciousness: sedated, patient cooperative and responds to stimulaton  Airway & Oxygen Therapy: Patient Spontanous Breathing and Patient connected to face mask oxgen  Post-op Assessment: Report given to PACU RN and Post -op Vital signs reviewed and stable  Post vital signs: Reviewed and stable  Complications: No apparent anesthesia complications

## 2012-02-14 NOTE — H&P (View-Only) (Signed)
Patient ID: Larry Mcclure, male   DOB: 03/30/1961, 51 y.o.   MRN: [id number]  Chief Complaint  Patient presents with  . Pre-op Exam    reck before t colectomy 02/14/12    HPI Larry Mcclure is a 51 y.o. male.   HPI This is a 51-year-old male who I saw motor to ago after undergoing a screening colonoscopy with a proximal descending colon polyp. This is an adenomatous polyp. He was referred for surgery as this was not able to be resected endoscopically. He wanted to do this this week. I didn't come back now just to reexamine him prior to surgery. He reports no interval change .  Past Medical History  Diagnosis Date  . Colon polyp     Past Surgical History  Procedure Date  . Tonsillectomy 1973    Family History  Problem Relation Age of Onset  . COPD Mother   . Heart disease Mother   . Heart disease Father   . Cancer Sister     GI tumor    Social History History  Substance Use Topics  . Smoking status: Never Smoker   . Smokeless tobacco: Never Used  . Alcohol Use: Yes     once per month    Allergies  Allergen Reactions  . Penicillins Rash    Happened in childhood    Current Outpatient Prescriptions  Medication Sig Dispense Refill  . Aspirin-Acetaminophen-Caffeine (GOODYS EXTRA STRENGTH) 500-325-65 MG PACK Take 1 Package by mouth every 4 (four) hours as needed. Pain        Review of Systems Review of Systems  Constitutional: Negative for fever, chills and unexpected weight change.  HENT: Negative for hearing loss, congestion, sore throat, trouble swallowing and voice change.   Eyes: Negative for visual disturbance.  Respiratory: Negative for cough and wheezing.   Cardiovascular: Negative for chest pain, palpitations and leg swelling.  Gastrointestinal: Negative for nausea, vomiting, abdominal pain, diarrhea, constipation, blood in stool, abdominal distention, anal bleeding and rectal pain.  Genitourinary: Negative for hematuria and difficulty urinating.    Musculoskeletal: Negative for arthralgias.  Skin: Negative for rash and wound.  Neurological: Negative for seizures, syncope, weakness and headaches.  Hematological: Negative for adenopathy. Does not bruise/bleed easily.  Psychiatric/Behavioral: Negative for confusion.    Blood pressure 144/88, pulse 72, temperature 97.8 F (36.6 C), temperature source Temporal, resp. rate 16, height 6' 4" (1.93 m), weight 357 lb 12.8 oz (162.297 kg).  Physical Exam Physical Exam  Vitals reviewed. Constitutional: He appears well-developed and well-nourished.  Cardiovascular: Normal rate, regular rhythm and normal heart sounds.   Pulmonary/Chest: Effort normal and breath sounds normal. He has no wheezes. He has no rales.  Abdominal: Soft. Bowel sounds are normal. He exhibits no mass. There is no tenderness.      Assessment    Right colon polyp    Plan    We again discussed a laparoscopic possible open right colectomy. We discussed again the risks and benefits associated with surgery. We'll plan on proceeding Wednesday as scheduled.       Larry Mcclure 02/12/2012, 10:21 AM    

## 2012-02-14 NOTE — Interval H&P Note (Signed)
History and Physical Interval Note:  02/14/2012 8:05 AM  Larry Mcclure  has presented today for surgery, with the diagnosis of colon mass  The various methods of treatment have been discussed with the patient and family. After consideration of risks, benefits and other options for treatment, the patient has consented to  Procedure(s) (LRB): LAPAROSCOPIC PARTIAL COLECTOMY (Right) as a surgical intervention .  The patients' history has been reviewed, patient examined, no change in status, stable for surgery.  I have reviewed the patients' chart and labs.  Questions were answered to the patient's satisfaction.     [name]

## 2012-02-14 NOTE — Anesthesia Preprocedure Evaluation (Addendum)
Anesthesia Evaluation  Patient identified by MRN, date of birth, ID band Patient awake    Reviewed: Allergy & Precautions, H&P , NPO status , Patient's Chart, lab work & pertinent test results  Airway Mallampati: II TM Distance: >3 FB Neck ROM: Full    Dental No notable dental hx.    Pulmonary  Probable OSA clear to auscultation  Pulmonary exam normal       Cardiovascular neg cardio ROS Regular Normal    Neuro/Psych Negative Neurological ROS  Negative Psych ROS   GI/Hepatic negative GI ROS, Neg liver ROS,   Endo/Other  Morbid obesity  Renal/GU negative Renal ROS  Genitourinary negative   Musculoskeletal negative musculoskeletal ROS (+)   Abdominal   Peds negative pediatric ROS (+)  Hematology negative hematology ROS (+)   Anesthesia Other Findings   Reproductive/Obstetrics negative OB ROS                          Anesthesia Physical Anesthesia Plan  ASA: III  Anesthesia Plan: General   Post-op Pain Management:    Induction: Intravenous  Airway Management Planned: Oral ETT  Additional Equipment:   Intra-op Plan:   Post-operative Plan: Extubation in OR  Informed Consent: I have reviewed the patients History and Physical, chart, labs and discussed the procedure including the risks, benefits and alternatives for the proposed anesthesia with the patient or authorized representative who has indicated his/her understanding and acceptance.   Dental advisory given  Plan Discussed with: CRNA  Anesthesia Plan Comments:         Anesthesia Quick Evaluation

## 2012-02-15 LAB — CBC
MCH: 26.7 pg (ref 26.0–34.0)
MCV: 84.1 fL (ref 78.0–100.0)
Platelets: 144 10*3/uL — ABNORMAL LOW (ref 150–400)
RDW: 14.3 % (ref 11.5–15.5)

## 2012-02-15 LAB — BASIC METABOLIC PANEL
CO2: 28 mEq/L (ref 19–32)
Calcium: 8 mg/dL — ABNORMAL LOW (ref 8.4–10.5)
Creatinine, Ser: 1.1 mg/dL (ref 0.50–1.35)
Glucose, Bld: 109 mg/dL — ABNORMAL HIGH (ref 70–99)

## 2012-02-15 MED ORDER — CHLORHEXIDINE GLUCONATE 0.12 % MT SOLN
15.0000 mL | Freq: Two times a day (BID) | OROMUCOSAL | Status: DC
  Administered 2012-02-15 – 2012-02-18 (×5): 15 mL via OROMUCOSAL
  Filled 2012-02-15 (×8): qty 15

## 2012-02-15 MED ORDER — BIOTENE DRY MOUTH MT LIQD
15.0000 mL | Freq: Two times a day (BID) | OROMUCOSAL | Status: DC
  Administered 2012-02-15 – 2012-02-17 (×4): 15 mL via OROMUCOSAL

## 2012-02-15 MED ORDER — DEXTROSE-NACL 5-0.45 % IV SOLN
INTRAVENOUS | Status: DC
  Administered 2012-02-15 – 2012-02-16 (×4): via INTRAVENOUS
  Filled 2012-02-15: qty 1000

## 2012-02-15 NOTE — Progress Notes (Signed)
1 Day Post-Op  Subjective: No nausea/vomiting, no flatus yet, ambulating, pain controlled  Objective: Vital signs in last 24 hours: Temp:  [97.4 F (36.3 C)-99 F (37.2 C)] 97.4 F (36.3 C) (02/28 0536) Pulse Rate:  [73-86] 83  (02/28 0536) Resp:  [13-21] 18  (02/28 0536) BP: (117-156)/(64-84) 127/69 mmHg (02/28 0536) SpO2:  [95 %-100 %] 99 % (02/28 0536) Weight:  [357 lb 12.8 oz (162.297 kg)] 357 lb 12.8 oz (162.297 kg) (02/27 1515) Last BM Date: 02/14/12  Intake/Output from previous day: 02/27 0701 - 02/28 0700 In: 3200 [I.V.:3200] Out: 1235 [Urine:1035; Blood:200] Intake/Output this shift:    General appearance: no distress Resp: clear to auscultation bilaterally Cardio: regular rate and rhythm, S1, S2 normal, no murmur, click, rub or gallop GI: dressing with some drainage, no real bs, approp tender  Lab Results:   [facility] 02/15/12 0420  WBC 9.2  HGB 12.4*  HCT 39.1  PLT 144*   BMET  Basename 02/15/12 0420  NA 135  K 3.8  CL 102  CO2 28  GLUCOSE 109*  BUN 21  CREATININE 1.10  CALCIUM 8.0*   Assessment/Plan: POD #1 lap assisted right colectomy 1. Neuro- cont pca for today, cont entereg 2. CV/Pulm- aggressive pulm toilet 3. GI- sips of clears, ice chips, await ileus to resolve 4. Renal- dc foley, cr stable 5. Heme- hct ok, will recheck platelets in 48 hours again 6. Heparin/scds   LOS: 1 day    [facility] 02/15/2012

## 2012-02-16 NOTE — Progress Notes (Signed)
2 Days Post-Op  Subjective: No nausea/vomiting, no flatus, ambulating  Objective: Vital signs in last 24 hours: Temp:  [97.6 F (36.4 C)-100.4 F (38 C)] 97.6 F (36.4 C) (03/01 0600) Pulse Rate:  [75-90] 78  (03/01 0600) Resp:  [15-18] 16  (03/01 0600) BP: (110-135)/(62-78) 110/69 mmHg (03/01 0600) SpO2:  [97 %-99 %] 99 % (03/01 0600) Last BM Date: 02/14/12  Intake/Output from previous day: 02/28 0701 - 03/01 0700 In: 1570 [P.O.:360; I.V.:1210] Out: 2630 [Urine:2630] Intake/Output this shift: Total I/O In: 1210 [I.V.:1210] Out: 1600 [Urine:1600]  General appearance: no distress Resp: clear to auscultation bilaterally Cardio: RRR GI: soft, appropr tender, bs present, wound with expected drainage wicks in place, no infection  Lab Results:   [facility] 02/15/12 0420  WBC 9.2  HGB 12.4*  HCT 39.1  PLT 144*   BMET  Basename 02/15/12 0420  NA 135  K 3.8  CL 102  CO2 28  GLUCOSE 109*  BUN 21  CREATININE 1.10  CALCIUM 8.0*    Assessment/Plan: POD #2 lap assist right colectomy 1. Neuro- cont pca as needed 2. CV/Pulm- continue aggressive pulmonary toilet 3. GI- no flatus, ileus appears to be resolving, will let have clears today, cont entereg 4. Renal- good UOP, check labs in am 5. Path pending 6. Scds, sq heparin  LOS: 2 days    [facility] 02/16/2012

## 2012-02-17 LAB — BASIC METABOLIC PANEL
BUN: 11 mg/dL (ref 6–23)
Calcium: 9.1 mg/dL (ref 8.4–10.5)
Chloride: 98 mEq/L (ref 96–112)
Creatinine, Ser: 0.94 mg/dL (ref 0.50–1.35)
GFR calc Af Amer: >90 mL/min (ref >90)
GFR calc non Af Amer: >90 mL/min (ref >90)

## 2012-02-17 LAB — CBC
HCT: 44.2 % (ref 39.0–52.0)
MCH: 27.6 pg (ref 26.0–34.0)
MCHC: 32.8 g/dL (ref 30.0–36.0)
MCV: 84.2 fL (ref 78.0–100.0)
Platelets: 199 10*3/uL (ref 150–400)
RDW: 13.9 % (ref 11.5–15.5)
WBC: 10.3 10*3/uL (ref 4.0–10.5)

## 2012-02-17 MED ORDER — OXYCODONE-ACETAMINOPHEN 5-325 MG PO TABS: 1.0000 | ORAL_TABLET | ORAL | Status: DC | PRN

## 2012-02-17 NOTE — Progress Notes (Signed)
Patient ID: Larry Mcclure, male   DOB: May 23, 1961, 51 y.o.   MRN: [id number] 3 Days Post-Op  Subjective: No complaints. Has had several loose bowel movements. Some cramping with bowel movements but no severe pain. Tolerating clear liquids without nausea.  Objective: Vital signs in last 24 hours: Temp:  [97.6 F (36.4 C)-99.4 F (37.4 C)] 97.6 F (36.4 C) (03/02 0600) Pulse Rate:  [66-77] 66  (03/02 0600) Resp:  [15-18] 15  (03/02 0841) BP: (117-131)/(72-74) 117/73 mmHg (03/02 0600) SpO2:  [97 %-99 %] 98 % (03/02 0841) Last BM Date: 02/15/12  Intake/Output from previous day: 03/01 0701 - 03/02 0700 In: 2503.3 [P.O.:600; I.V.:1903.3] Out: 2325 [Urine:2325] Intake/Output this shift:    General appearance: alert and no distress GI: normal findings: soft, non-tender Incision/Wound:dressing dry and intact  Lab Results:   Basename 02/17/12 0810 02/15/12 0420  WBC 10.3 9.2  HGB 14.5 12.4*  HCT 44.2 39.1  PLT 199 144*   BMET  Basename 02/17/12 0810 02/15/12 0420  NA 135 135  K 3.4* 3.8  CL 98 102  CO2 30 28  GLUCOSE 108* 109*  BUN 11 21  CREATININE 0.94 1.10  CALCIUM 9.1 8.0*     Studies/Results: No results found.  Anti-infectives: Anti-infectives     Start     Dose/Rate Route Frequency Ordered Stop   02/14/12 0630   ertapenem (INVANZ) 1 g in sodium chloride 0.9 % 50 mL IVPB        1 g 100 mL/hr over 30 Minutes Intravenous 60 min pre-op 02/14/12 7846 02/14/12 0859          Assessment/Plan: s/p Procedure(s): LAPAROSCOPIC RIGHT COLECTOMY Doing well postoperatively. We will advance diet. DC PCA and oxygen. Increase activity. Anticipate discharge tomorrow.   LOS: 3 days    Larry Mcclure T 02/17/2012

## 2012-02-18 MED ORDER — OXYCODONE-ACETAMINOPHEN 5-325 MG PO TABS
1.0000 | ORAL_TABLET | ORAL | Status: AC | PRN
Start: 2012-02-18 — End: 2012-02-28

## 2012-02-18 NOTE — Progress Notes (Signed)
Patient ID: Larry Mcclure, male   DOB: 09-19-1961, 51 y.o.   MRN: [id number] 4 Days Post-Op  Subjective: No complaints. He is tolerating his diet without difficulty. Has had a couple of loose bowel movements. Up walking without difficulty.  Objective: Vital signs in last 24 hours: Temp:  [97.9 F (36.6 C)-98.4 F (36.9 C)] 98 F (36.7 C) (03/03 0600) Pulse Rate:  [64-88] 64  (03/03 0600) Resp:  [18-19] 18  (03/03 0600) BP: (133-139)/(60-68) 133/60 mmHg (03/03 0600) SpO2:  [94 %-97 %] 96 % (03/03 0600) Last BM Date: 02/16/12  Intake/Output from previous day: 03/02 0701 - 03/03 0700 In: 600 [P.O.:600] Out: 500 [Urine:500] Intake/Output this shift:    General appearance: alert and no distress GI: normal findings: soft, non-tender Incision/Wound: no sign of infection. Wicks are removed and wound redressed.  Lab Results:   [facility] 02/17/12 0810  WBC 10.3  HGB 14.5  HCT 44.2  PLT 199   BMET  Basename 02/17/12 0810  NA 135  K 3.4*  CL 98  CO2 30  GLUCOSE 108*  BUN 11  CREATININE 0.94  CALCIUM 9.1     Studies/Results: No results found.  Anti-infectives: Anti-infectives     Start     Dose/Rate Route Frequency Ordered Stop   02/14/12 0630   ertapenem (INVANZ) 1 g in sodium chloride 0.9 % 50 mL IVPB        1 g 100 mL/hr over 30 Minutes Intravenous 60 min pre-op 02/14/12 4098 02/14/12 0859          Assessment/Plan: s/p Procedure(s): LAPAROSCOPIC RIGHT COLECTOMY Doing very well postoperatively with no complications identified. Discharge home today.   LOS: 4 days    Cozette Braggs T 02/18/2012

## 2012-02-18 NOTE — Progress Notes (Signed)
Assessment unchanged. Pt and wife verbalized understanding of dc instructions. Script x1 given to pt as provided by MD.  Discharged via wc to front entrance to meet awaiting vehicle to carry home. Accompanied by wife and nurse tech x2.

## 2012-02-20 NOTE — Discharge Summary (Signed)
Physician Discharge Summary  Patient ID: Larry Mcclure MRN: [id number] DOB/AGE: 1961/10/28 51 y.o.  Admit date: 02/14/2012 Discharge date: 02/20/2012  Admission Diagnoses: Right colon polyp  Discharge Diagnoses:  Active Problems:  * No active hospital problems. *    Discharged Condition: good  Hospital Course: 22 yom who had right colon polyp unresectable by colonoscopically.  He was admitted and underwent a laparoscopic assisted right hemicolectomy.  Postoperatively he was advanced on his diet after ileus resolved.  Pain was well controlled.  He was discharged having normal bowel movements, tolerating diet and pain controlled on oral medication.  His pathology shows a TVA.  Consults: None  Significant Diagnostic Studies: none  Treatments: surgery: laparoscopic assisted right colectomy    Disposition: Home-Health Care Svc  Discharge Orders    Future Appointments: Provider: Department: Dept Phone: Center:   02/23/2012 9:30 AM [facility] [phone] None   04/08/2012 9:00 AM Emelia Loron, MD [facility] [phone] None     Future Orders Please Complete By Expires   Discharge patient        Medication List  As of 02/20/2012  9:49 AM   STOP taking these medications         GOODYS EXTRA STRENGTH 500-325-65 MG Pack      polyethylene glycol 236 G solution         TAKE these medications         oxyCODONE-acetaminophen 5-325 MG per tablet   Commonly known as: PERCOCET   Take 1-2 tablets by mouth every 4 (four) hours as needed.           Follow-up Information    Follow up with [facility], MD. Schedule an appointment as soon as possible for a visit in 10 days.   Contact information:   [employer] [address] [place] [address] [phone]          Signed: Emelia Loron 02/20/2012, 9:49 AM

## 2012-02-23 ENCOUNTER — Ambulatory Visit (INDEPENDENT_AMBULATORY_CARE_PROVIDER_SITE_OTHER): Payer: 59 | Admitting: General Surgery

## 2012-02-23 ENCOUNTER — Encounter (INDEPENDENT_AMBULATORY_CARE_PROVIDER_SITE_OTHER): Payer: 59

## 2012-02-23 ENCOUNTER — Encounter (INDEPENDENT_AMBULATORY_CARE_PROVIDER_SITE_OTHER): Payer: Self-pay | Admitting: General Surgery

## 2012-02-23 DIAGNOSIS — Z09 Encounter for follow-up examination after completed treatment for conditions other than malignant neoplasm: Secondary | ICD-10-CM

## 2012-02-23 NOTE — Progress Notes (Signed)
Subjective:     Patient ID: Larry Mcclure, male   DOB: [birth year], 51 y.o.   MRN: [id number]  HPI This is a 51 year old male who underwent a laparoscopic-assisted right hemicolectomy about 10 days ago. He was discharged home at postoperative day 4. He is doing well having bowel movements and eating normally. He is slowly increasing his activity. He is at a little bit of serous drainage from the upper portion of his wound but denies any fevers or any other significant complaints. 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  Review of Systems     Objective:   Physical Exam Lap incisions healing well, midline with minimal serous drainage, no erythema, I probed to see if there was fluid collection and there is not currently    Assessment:     S/p lap assist right colon for tva    Plan:     I removed all staples I think his wound will be ok and will follow up in about 2 weeks I gave them warning signs to look for if he ends up having a wound infection

## 2012-03-13 ENCOUNTER — Encounter (INDEPENDENT_AMBULATORY_CARE_PROVIDER_SITE_OTHER): Payer: 59 | Admitting: General Surgery

## 2012-03-14 ENCOUNTER — Ambulatory Visit (INDEPENDENT_AMBULATORY_CARE_PROVIDER_SITE_OTHER): Payer: 59 | Admitting: General Surgery

## 2012-03-14 ENCOUNTER — Encounter (INDEPENDENT_AMBULATORY_CARE_PROVIDER_SITE_OTHER): Payer: Self-pay | Admitting: General Surgery

## 2012-03-14 VITALS — BP 132/84 | HR 86 | Temp 99.3°F | Resp 18 | Ht 76.0 in | Wt 346.0 lb

## 2012-03-14 DIAGNOSIS — Z09 Encounter for follow-up examination after completed treatment for conditions other than malignant neoplasm: Secondary | ICD-10-CM

## 2012-03-14 NOTE — Progress Notes (Signed)
Subjective:     Patient ID: Larry Mcclure, male   DOB: 1961-06-05, 51 y.o.   MRN: [id number]  HPI 40 yof s/p lap assisted right colectomy for tva.  He had superficial wound infection that is nearly healed now.  Still draining some from incision.  Energy level not normal yet.  Appetite improving.  Having bowel movements.  Review of Systems     Objective:   Physical Exam Wound healing.  There is 6 mm area in superior aspect with granulation tissue and 1 mm area below this.  No infection    Assessment:     S/p right colon    Plan:     We discussed his energy and appetite will continue to improve. Continue exercise and eat well. I silver nitrated wound today and will likely need to do again to get skin to heal over. RTC 3 weeks.

## 2012-04-08 ENCOUNTER — Ambulatory Visit (INDEPENDENT_AMBULATORY_CARE_PROVIDER_SITE_OTHER): Payer: 59 | Admitting: General Surgery

## 2012-04-08 ENCOUNTER — Encounter (INDEPENDENT_AMBULATORY_CARE_PROVIDER_SITE_OTHER): Payer: Self-pay | Admitting: General Surgery

## 2012-04-08 VITALS — BP 124/82 | HR 80 | Resp 18 | Ht 76.0 in | Wt 343.0 lb

## 2012-04-08 DIAGNOSIS — Z09 Encounter for follow-up examination after completed treatment for conditions other than malignant neoplasm: Secondary | ICD-10-CM

## 2012-04-08 NOTE — Progress Notes (Signed)
Subjective:     Patient ID: Larry Mcclure, male   DOB: [birth year], 51 y.o.   MRN: [id number]  HPI This is a 51 year old male underwent a laparoscopic-assisted right colectomy for a tubulovillous adenoma. He's done very well and returns today without any significant complaints. He had an episode when he was out of town and after eating some [place] food he had some bloating as well as a green bowel movement. This is all returned to normal at this point. He is eating well. He has no real pain. His appetitie and his energy level are basically back to normal. He has a small area in his wound that is still superficially open and I applied a little bit more so for nitrate today but is significantly better last time it should heal soon.  Review of Systems     Objective:   Physical Exam Healing wound without infection, 4 mm superficial open area    Assessment:     S/p lap right colon for tva    Plan:     Return to work next Monday Full activity Return in 3-4 weeks if wound not healed Otherwise follow up as needed I reassured him that episode is normal

## 2013-08-26 ENCOUNTER — Other Ambulatory Visit (HOSPITAL_BASED_OUTPATIENT_CLINIC_OR_DEPARTMENT_OTHER): Payer: Self-pay | Admitting: Physician Assistant

## 2013-08-26 ENCOUNTER — Other Ambulatory Visit: Payer: Self-pay | Admitting: Family Medicine

## 2013-08-26 DIAGNOSIS — R109 Unspecified abdominal pain: Secondary | ICD-10-CM

## 2013-08-27 ENCOUNTER — Encounter (HOSPITAL_COMMUNITY): Payer: Self-pay

## 2013-08-27 ENCOUNTER — Observation Stay (HOSPITAL_COMMUNITY)
Admission: EM | Admit: 2013-08-27 | Discharge: 2013-08-28 | Disposition: A | Discharge status: Home / Self Care | Payer: 59 | Attending: Surgery | Admitting: Surgery

## 2013-08-27 ENCOUNTER — Emergency Department (HOSPITAL_COMMUNITY): Payer: 59

## 2013-08-27 DIAGNOSIS — N390 Urinary tract infection, site not specified: Secondary | ICD-10-CM

## 2013-08-27 DIAGNOSIS — K8020 Calculus of gallbladder without cholecystitis without obstruction: Secondary | ICD-10-CM

## 2013-08-27 DIAGNOSIS — K8010 Calculus of gallbladder with chronic cholecystitis without obstruction: Secondary | ICD-10-CM | POA: Insufficient documentation

## 2013-08-27 DIAGNOSIS — K432 Incisional hernia without obstruction or gangrene: Secondary | ICD-10-CM | POA: Insufficient documentation

## 2013-08-27 DIAGNOSIS — E860 Dehydration: Secondary | ICD-10-CM | POA: Diagnosis present

## 2013-08-27 DIAGNOSIS — Z9889 Other specified postprocedural states: Secondary | ICD-10-CM

## 2013-08-27 DIAGNOSIS — K859 Acute pancreatitis without necrosis or infection, unspecified: Principal | ICD-10-CM

## 2013-08-27 DIAGNOSIS — R109 Unspecified abdominal pain: Secondary | ICD-10-CM

## 2013-08-27 DIAGNOSIS — Z9049 Acquired absence of other specified parts of digestive tract: Secondary | ICD-10-CM

## 2013-08-27 DIAGNOSIS — R82998 Other abnormal findings in urine: Secondary | ICD-10-CM | POA: Insufficient documentation

## 2013-08-27 LAB — COMPREHENSIVE METABOLIC PANEL
ALT: 122 U/L — ABNORMAL HIGH (ref 0–53)
AST: 82 U/L — ABNORMAL HIGH (ref 0–37)
Albumin: 3 g/dL — ABNORMAL LOW (ref 3.5–5.2)
CO2: 23 mEq/L (ref 19–32)
Calcium: 8.9 mg/dL (ref 8.4–10.5)
Chloride: 99 mEq/L (ref 96–112)
Creatinine, Ser: 0.95 mg/dL (ref 0.50–1.35)
GFR calc non Af Amer: >90 mL/min (ref >90)
Sodium: 132 mEq/L — ABNORMAL LOW (ref 135–145)
Total Bilirubin: 3.7 mg/dL — ABNORMAL HIGH (ref 0.3–1.2)

## 2013-08-27 LAB — URINALYSIS, ROUTINE W REFLEX MICROSCOPIC
Glucose, UA: NEGATIVE mg/dL
Hgb urine dipstick: NEGATIVE
Protein, ur: 30 mg/dL — AB
pH: 5.5 (ref 5.0–8.0)

## 2013-08-27 LAB — CBC WITH DIFFERENTIAL/PLATELET
Basophils Absolute: 0 10*3/uL (ref 0.0–0.1)
Basophils Relative: 0 % (ref 0–1)
Lymphocytes Relative: 15 % (ref 12–46)
MCHC: 32.8 g/dL (ref 30.0–36.0)
Monocytes Absolute: 1 10*3/uL (ref 0.1–1.0)
Neutro Abs: 4.6 10*3/uL (ref 1.7–7.7)
Platelets: 174 10*3/uL (ref 150–400)
RDW: 15 % (ref 11.5–15.5)
WBC: 6.7 10*3/uL (ref 4.0–10.5)

## 2013-08-27 LAB — URINE MICROSCOPIC-ADD ON

## 2013-08-27 MED ORDER — ONDANSETRON HCL 4 MG/2ML IJ SOLN: 4.0000 mg | Freq: Four times a day (QID) | INTRAMUSCULAR | Status: DC | PRN

## 2013-08-27 MED ORDER — ONDANSETRON HCL 4 MG/2ML IJ SOLN: 4.0000 mg | Freq: Once | INTRAMUSCULAR | Status: DC

## 2013-08-27 MED ORDER — SODIUM CHLORIDE 0.9 % IV BOLUS (SEPSIS)
1000.0000 mL | Freq: Once | INTRAVENOUS | Status: AC
  Administered 2013-08-27: 1000 mL via INTRAVENOUS

## 2013-08-27 MED ORDER — SODIUM CHLORIDE 0.9 % IV SOLN
INTRAVENOUS | Status: DC
  Administered 2013-08-28 (×3): via INTRAVENOUS

## 2013-08-27 MED ORDER — CIPROFLOXACIN IN D5W 400 MG/200ML IV SOLN: 400.0000 mg | Freq: Two times a day (BID) | INTRAVENOUS | Status: DC

## 2013-08-27 MED ORDER — HYDROMORPHONE HCL PF 1 MG/ML IJ SOLN
1.0000 mg | INTRAMUSCULAR | Status: DC | PRN
  Administered 2013-08-27: 1 mg via INTRAVENOUS
  Filled 2013-08-27: qty 1

## 2013-08-27 MED ORDER — IOHEXOL 300 MG/ML  SOLN
25.0000 mL | INTRAMUSCULAR | Status: DC
  Administered 2013-08-27: 25 mL via ORAL

## 2013-08-27 MED ORDER — MORPHINE SULFATE 4 MG/ML IJ SOLN: 4.0000 mg | Freq: Once | INTRAMUSCULAR | Status: DC

## 2013-08-27 MED ORDER — ONDANSETRON HCL 4 MG PO TABS: 4.0000 mg | ORAL_TABLET | Freq: Four times a day (QID) | ORAL | Status: DC | PRN

## 2013-08-27 MED ORDER — ACETAMINOPHEN 650 MG RE SUPP: 650.0000 mg | Freq: Four times a day (QID) | RECTAL | Status: DC | PRN

## 2013-08-27 MED ORDER — CIPROFLOXACIN IN D5W 400 MG/200ML IV SOLN
400.0000 mg | Freq: Once | INTRAVENOUS | Status: AC
  Administered 2013-08-27: 400 mg via INTRAVENOUS
  Filled 2013-08-27: qty 200

## 2013-08-27 MED ORDER — ACETAMINOPHEN 325 MG PO TABS: 650.0000 mg | ORAL_TABLET | Freq: Four times a day (QID) | ORAL | Status: DC | PRN

## 2013-08-27 MED ORDER — IOHEXOL 300 MG/ML  SOLN
100.0000 mL | Freq: Once | INTRAMUSCULAR | Status: AC | PRN
  Administered 2013-08-27: 100 mL via INTRAVENOUS

## 2013-08-27 NOTE — ED Notes (Signed)
Pt went to MD office yesterday for abd and back pain and tea colored urine. Has been having right flank pain and feeling of gas in abd. Pt denies nausea or vomiting, but still having gas pain.

## 2013-08-27 NOTE — ED Notes (Signed)
Pt undressed, in gown, on continuous pulse oximetry and blood pressure cuff 

## 2013-08-27 NOTE — ED Notes (Signed)
CT notified pt finished cup of contrast.

## 2013-08-27 NOTE — ED Provider Notes (Signed)
CSN: [id number]     Arrival date & time 08/27/13  1478 History   First MD Initiated Contact with Patient 08/27/13 [phone]     Chief Complaint  Patient presents with  . Abdominal Pain   (Consider location/radiation/quality/duration/timing/severity/associated sxs/prior Treatment) HPI Comments: Patient is a 52 year old male with no significant past medical history who presents with a 1 week history of epigastric pain and right flank pain. The pain is located in his epigastrium and right flank. The pain is described as dull/aching and moderate. The pain started gradually and progressively worsened since the onset. No alleviating/aggravating factors. The patient has tried nothing for symptoms without relief. Associated symptoms include dysuria and "dark urine." Patient denies fever, headache, NVD, chest pain, SOB, dysuria, constipation. Patient was seen at his PCP office yesterday and instructed to come to the ED today when his labs resulted. Patient was told he had elevated LFT and lipase.   Patient is a 52 y.o. male presenting with abdominal pain.  Abdominal Pain Associated symptoms: dysuria     Past Medical History  Diagnosis Date  . Colon polyp    Past Surgical History  Procedure Laterality Date  . Tonsillectomy  1973  . Colon surgery      lap assisted right colectomy   Family History  Problem Relation Age of Onset  . COPD Mother   . Heart disease Mother   . Heart disease Father   . Cancer Sister     GI tumor   History  Substance Use Topics  . Smoking status: Never Smoker   . Smokeless tobacco: Never Used  . Alcohol Use: Yes     Comment: once per month    Review of Systems  Gastrointestinal: Positive for abdominal pain.       Abdominal bloating  Genitourinary: Positive for dysuria and flank pain.  All other systems reviewed and are negative.    Allergies  Penicillins  Home Medications   Current Outpatient Rx  Name  Route  Sig  Dispense  Refill  .  S-Adenosylmethionine (SAM-E) 400 MG TABS   Oral   Take 1 tablet by mouth daily.          BP 131/63  Pulse 80  Temp(Src) 98.5 F (36.9 C) (Oral)  Resp 20  Ht 6\' 4"  (1.93 m)  Wt 290 lb (131.543 kg)  BMI 35.31 kg/m2  SpO2 99% Physical Exam  Nursing note and vitals reviewed. Constitutional: He is oriented to person, place, and time. He appears well-developed and well-nourished. No distress.  HENT:  Head: Normocephalic and atraumatic.  Eyes: Conjunctivae and EOM are normal. Pupils are equal, round, and reactive to light. Scleral icterus is present.  Mild scleral icterus.   Neck: Normal range of motion.  Cardiovascular: Normal rate and regular rhythm.  Exam reveals no gallop and no friction rub.   No murmur heard. Pulmonary/Chest: Effort normal and breath sounds normal. He has no wheezes. He has no rales. He exhibits no tenderness.  Abdominal: Soft. He exhibits no distension. There is tenderness. There is no rebound and no guarding.  Mild epigastric tenderness to palpation. No peritoneal signs or focal tenderness to palpation.   Genitourinary:  No CVA tenderness to palpation.  Musculoskeletal: Normal range of motion.  Neurological: He is alert and oriented to person, place, and time. Coordination normal.  Speech is goal-oriented. Moves limbs without ataxia.   Skin: Skin is warm and dry.  jaundice  Psychiatric: He has a normal mood and affect. His  behavior is normal.    ED Course  Procedures (including critical care time) Labs Review Labs Reviewed  CBC WITH DIFFERENTIAL - Abnormal; Notable for the following:    Monocytes Relative 15 (*)    All other components within normal limits  COMPREHENSIVE METABOLIC PANEL - Abnormal; Notable for the following:    Sodium 132 (*)    Glucose, Bld 105 (*)    Albumin 3.0 (*)    AST 82 (*)    ALT 122 (*)    Alkaline Phosphatase 190 (*)    Total Bilirubin 3.7 (*)    All other components within normal limits  LIPASE, BLOOD - Abnormal;  Notable for the following:    Lipase 1960 (*)    All other components within normal limits  URINALYSIS, ROUTINE W REFLEX MICROSCOPIC - Abnormal; Notable for the following:    Color, Urine ORANGE (*)    Specific Gravity, Urine 1.031 (*)    Bilirubin Urine MODERATE (*)    Ketones, ur 15 (*)    Protein, ur 30 (*)    Urobilinogen, UA 2.0 (*)    Nitrite POSITIVE (*)    Leukocytes, UA SMALL (*)    All other components within normal limits  URINE MICROSCOPIC-ADD ON - Abnormal; Notable for the following:    Bacteria, UA FEW (*)    Casts HYALINE CASTS (*)    All other components within normal limits  URINE CULTURE   Imaging Review Ct Abdomen Pelvis W Contrast  08/27/2013   *RADIOLOGY REPORT*  Clinical Data: Abdomen and back pain for several days history of prior bowel resection for benign lesion  CT ABDOMEN AND PELVIS WITH CONTRAST  Technique:  Multidetector CT imaging of the abdomen and pelvis was performed following the standard protocol during bolus administration of intravenous contrast.  Contrast: OMNIPAQUE IOHEXOL 300 MG/ML  SOLN  Comparison: None.  Findings: The lung bases are clear.  The liver enhances with no focal abnormality and no ductal dilatation is noted.  A few small calcified hepatic granulomas are present.  The gallbladder is visualized and no calcified gallstones are seen.  The pancreas is normal in size and the pancreatic duct is not dilated.  The adrenal glands are symmetrical and unremarkable, and several calcified splenic granulomas are present consistent with prior granulomatous disease.  The kidneys enhance with no calculus or mass and on delayed images, the pelvocaliceal systems are unremarkable.  The proximal ureters are normal in caliber.  The abdominal aorta is normal in caliber.  No adenopathy is seen.  A periumbilical hernia is present containing primarily fat.  The urinary bladder is unremarkable and moderately distended.  The prostate is normal in size.  Fat enters  inguinal canals right greater than left.  No abnormality of the colon is seen.  The cecum is noted to be just beneath the tail of the right lobe liver. Degenerative change is noted throughout the facet joints of the lower lumbar spine.  IMPRESSION:  1.  No explanation for the patient's abdominal pain is seen. 2.  Changes of prior granulomatous disease with calcified hepatic and splenic granulomas. 3.  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   Original Report Authenticated By: Dwyane Dee, M.D.    MDM   1. Pancreatitis   2. UTI (urinary tract infection)     10:12 AM Labs and urinalysis pending. Vitals stable and patient afebrile. Patient refused pain medication at this time.   11:29 AM Labs show elevated  lipase and LFT. Urinalysis shows UTI. Patient will have CT abdomen pelvis.   4:03 PM CT abdomen pelvis unremarkable. Patient will be admitted for pancreatitis. Patient will have IV Cipro for UTI.     Emilia Beck, [place] 09/02/13 [phone]

## 2013-08-27 NOTE — H&P (Signed)
History and Physical       Hospital Admission Note Date: 08/27/2013  Patient name: Larry Mcclure Medical record number: [id number] Date of birth: 1961-07-15 Age: 52 y.o. Gender: male PCP: [facility]  Chief Complaint:  Abdominal pain with bloating for last one week   HPI: Patient is a 52 year old male with no symptoms medical history except right-sided colectomy for tubulovillous adenoma in 2013 (Dr. Dwain Sarna) presented to ER for epigastric and right sided abdominal pain. Patient reports that he has been having dull, aching, gas pains for last one week however progressively worsened in last 2 days. Patient had tried over-the-counter gas medications but did not improve his symptoms. He noticed right-sided pain, epigastric and back pain with abdominal bloating. Patient had gone to his PCPs office yesterday and was told to come to the ER today as he had elevated LFTs and lipase. Patient drinks minimal alcohol and very occasionally. In the ER CT abdomen was done with abdominal ultrasound which showed multiple shadowing calculi in the gallbladder with gallbladder wall thickening. Lipase is 1960 with transaminitis.  Review of Systems:  Constitutional: Denies fever, chills, diaphoresis, poor appetite and fatigue.  HEENT: Denies photophobia, eye pain, redness, hearing loss, ear pain, congestion, sore throat, rhinorrhea, sneezing, mouth sores, trouble swallowing, neck pain, neck stiffness and tinnitus.   Respiratory: Denies SOB, DOE, cough, chest tightness,  and wheezing.   Cardiovascular: Denies chest pain, palpitations and leg swelling.  Gastrointestinal: Please see history of present illness Genitourinary: Denies dysuria, urgency, frequency, hematuria, flank pain and difficulty urinating.  Musculoskeletal: Denies myalgias, back pain, joint swelling, arthralgias and gait problem.  Skin: Denies pallor, rash  and wound.  Neurological: Denies dizziness, seizures, syncope, weakness, light-headedness, numbness and headaches.  Hematological: Denies adenopathy. Easy bruising, personal or family bleeding history  Psychiatric/Behavioral: Denies suicidal ideation, mood changes, confusion, nervousness, sleep disturbance and agitation  Past Medical History: Past Medical History  Diagnosis Date  . Colon polyp    Past Surgical History  Procedure Laterality Date  . Tonsillectomy  1973  . Colon surgery      lap assisted right colectomy    Medications: Prior to Admission medications   Medication Sig Start Date End Date Taking? Authorizing Provider  S-Adenosylmethionine (SAM-E) 400 MG TABS Take 1 tablet by mouth daily.   Yes Historical Provider, MD    Allergies:   Allergies  Allergen Reactions  . Penicillins Rash    Happened in childhood    Social History:  reports that he has never smoked. He has never used smokeless tobacco. He reports that  drinks alcohol. He reports that he does not use illicit drugs.  Family History: Family History  Problem Relation Age of Onset  . COPD Mother   . Heart disease Mother   . Heart disease Father   . Cancer Sister     GI tumor    Physical Exam: Blood pressure 121/69, pulse 58, temperature 98.5 F (36.9 C), temperature source Oral, resp. rate 20, height 6\' 4"  (1.93 m), weight 131.543 kg (290 lb), SpO2 99.00%. General: Alert, awake, oriented x3, in no acute distress. HEENT: normocephalic, atraumatic, anicteric sclera, pink conjunctiva, pupils equal and reactive to light and accomodation, oropharynx clear Neck: supple, no masses or lymphadenopathy, no goiter, no bruits  Heart: Regular rate and rhythm, without murmurs, rubs or gallops. Lungs: Clear to auscultation bilaterally, no wheezing, rales or rhonchi. Abdomen: Soft, mild right upper quadrant pain on deep palpation, no rebound or guarding, nondistended, positive  bowel sounds, no masses. Extremities:  No clubbing, cyanosis or edema with positive pedal pulses. Neuro: Grossly intact, no focal neurological deficits, strength 5/5 upper and lower extremities bilaterally Psych: alert and oriented x 3, normal mood and affect Skin: no rashes or lesions, warm and dry   LABS on Admission:  Basic Metabolic Panel:  Recent Labs Lab 08/27/13 0930  NA 132*  K 3.6  CL 99  CO2 23  GLUCOSE 105*  BUN 19  CREATININE 0.95  CALCIUM 8.9   Liver Function Tests:  Recent Labs Lab 08/27/13 0930  AST 82*  ALT 122*  ALKPHOS 190*  BILITOT 3.7*  PROT 7.1  ALBUMIN 3.0*    Recent Labs Lab 08/27/13 0930  LIPASE 1961*   No results found for this basename: AMMONIA,  in the last 168 hours CBC:  Recent Labs Lab 08/27/13 0930  WBC 6.7  NEUTROABS 4.6  HGB 13.7  HCT 41.8  MCV 82.6  PLT 174   Cardiac Enzymes: No results found for this basename: CKTOTAL, CKMB, CKMBINDEX, TROPONINI,  in the last 168 hours BNP: No components found with this basename: POCBNP,  CBG: No results found for this basename: GLUCAP,  in the last 168 hours   Radiological Exams on Admission: US Abdomen Complete  08/27/2013   CLINICAL DATA:  Epigastric pain, elevated lipase  EXAM: ABDOMEN ULTRASOUND  COMPARISON:  CT abdomen 08/27/2013  FINDINGS: Gallbladder  Multiple shadowing calculi 1 in gallbladder. Mild gallbladder wall thickening. No sonographic Murphy sign. Question minimal pericholecystic fluid.  Common bile duct  Diameter: 4 mm diameter  Liver  Two calcified granulomata noted as seen on CT. Normal parenchymal echogenicity without mass.  IVC  Normal appearance  Pancreas  Obscured by bowel gas  Spleen  Normal-sized 10.1 cm length with calcified granuloma.  Right Kidney  Length: 12.5 cm  Normal morphology without mass or hydronephrosis.  Left Kidney  Length: 12.0 cm  Normal morphology without mass or hydronephrosis.  Abdominal aorta  Portions obscured by bowel gas, with visualized portions normal caliber.  No free-fluid   IMPRESSION: Cholelithiasis with gallbladder wall thickening, cannot exclude acute cholecystitis.  Nonvisualization of pancreas and portions of aorta.  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   Electronically Signed   By: Ulyses Southward M.D.   On: 08/27/2013 16:38   Ct Abdomen Pelvis W Contrast  08/27/2013   *RADIOLOGY REPORT*  Clinical Data: Abdomen and back pain for several days history of prior bowel resection for benign lesion  CT ABDOMEN AND PELVIS WITH CONTRAST  Technique:  Multidetector CT imaging of the abdomen and pelvis was performed following the standard protocol during bolus administration of intravenous contrast.  Contrast: OMNIPAQUE IOHEXOL 300 MG/ML  SOLN  Comparison: None.  Findings: The lung bases are clear.  The liver enhances with no focal abnormality and no ductal dilatation is noted.  A few small calcified hepatic granulomas are present.  The gallbladder is visualized and no calcified gallstones are seen.  The pancreas is normal in size and the pancreatic duct is not dilated.  The adrenal glands are symmetrical and unremarkable, and several calcified splenic granulomas are present consistent with prior granulomatous disease.  The kidneys enhance with no calculus or mass and on delayed images, the pelvocaliceal systems are unremarkable.  The proximal ureters are normal in caliber.  The abdominal aorta is normal in caliber.  No adenopathy is seen.  A periumbilical hernia is present containing primarily fat.  The urinary bladder is unremarkable and  moderately distended.  The prostate is normal in size.  Fat enters inguinal canals right greater than left.  No abnormality of the colon is seen.  The cecum is noted to be just beneath the tail of the right lobe liver. Degenerative change is noted throughout the facet joints of the lower lumbar spine.  IMPRESSION:  1.  No explanation for the patient's abdominal pain is seen. 2.  Changes of prior granulomatous disease with calcified hepatic  and splenic granulomas. 3.  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   Original Report Authenticated By: Dwyane Dee, M.D.    Assessment/Plan Principal Problem:   Acute pancreatitis: Secondary to gallstones. Patient drinks minimal alcohol occasionally. - Admit to MedSurg, aggressive IV fluid hydration, pain control, n.p.o. Status - D/w Dr. Lindie Spruce, will see patient, will likely need cholecystectomy. Dr. Lindie Spruce also recommended GI consult as patient may need ERCP, spoke with Dr Evette Cristal.   - Patient is allergic to penicillins, will continue on ciprofloxacin for now  Active Problems:   Cholelithiasis: As #1    Dehydration: Continue aggressive IV fluid hydration  Pyuria: No complaints of any dysuria or hematuria - Will await urine culture    Hx of right-sided colectomy for tubular villous adenoma: In 01/2012 The patient has been doing well, had followed Dr. Dwain Sarna outpatient  DVT prophylaxis: SCD's  CODE STATUS: Full Code  Further plan will depend as patient's clinical course evolves and further radiologic and laboratory data become available. I just spoke with Dr. Lindie Spruce who has already seen the patient and will assume care on surgery service. TR[facility] will sign-off. I will change attending to [facility] service.  Time Spent on Admission: 1 hour  Taison Celani M.D. [facility] 08/27/2013, 5:09 PM Pager: [phone]  If 7PM-7AM, please contact night-coverage [email or web address] Password [email or web address]

## 2013-08-27 NOTE — Consult Note (Signed)
Reason for Consult  :Abdominal pain and pancreatitis Referring Physician: Dr. Deloria Lair is an 52 y.o. male.  HPI: 52 year old male several days of worsening abdominal and back pain.  Pain and tenderness in the RUQ. + Murphy's sign.  Has nausea, no vomiting.  No fevers or chills.  CT negative, but lipase > 1900.  US shows gall stones.  No medications.  No other significant PMHx except R. Colectomy for polyp that was benign.  Past Medical History  Diagnosis Date  . Colon polyp     Past Surgical History  Procedure Laterality Date  . Tonsillectomy  1973  . Colon surgery      lap assisted right colectomy    Family History  Problem Relation Age of Onset  . COPD Mother   . Heart disease Mother   . Heart disease Father   . Cancer Sister     GI tumor    Social History:  reports that he has never smoked. He has never used smokeless tobacco. He reports that  drinks alcohol. He reports that he does not use illicit drugs.  Allergies:  Allergies  Allergen Reactions  . Penicillins Rash    Happened in childhood    Medications: I have reviewed the patient's current medications.  Results for orders placed during the hospital encounter of 08/27/13 (from the past 48 hour(s))  CBC WITH DIFFERENTIAL     Status: Abnormal   Collection Time    08/27/13  9:30 AM      Result Value Range   WBC 6.7  4.0 - 10.5 K/uL   RBC 5.06  4.22 - 5.81 MIL/uL   Hemoglobin 13.7  13.0 - 17.0 g/dL   HCT 44.0  34.7 - 42.5 %   MCV 82.6  78.0 - 100.0 fL   MCH 27.1  26.0 - 34.0 pg   MCHC 32.8  30.0 - 36.0 g/dL   RDW 95.6  38.7 - 56.4 %   Platelets 174  150 - 400 K/uL   Neutrophils Relative % 69  43 - 77 %   Neutro Abs 4.6  1.7 - 7.7 K/uL   Lymphocytes Relative 15  12 - 46 %   Lymphs Abs 1.0  0.7 - 4.0 K/uL   Monocytes Relative 15 (*) 3 - 12 %   Monocytes Absolute 1.0  0.1 - 1.0 K/uL   Eosinophils Relative 1  0 - 5 %   Eosinophils Absolute 0.1  0.0 - 0.7 K/uL   Basophils Relative 0  0 - 1 %   Basophils Absolute 0.0  0.0 - 0.1 K/uL  COMPREHENSIVE METABOLIC PANEL     Status: Abnormal   Collection Time    08/27/13  9:30 AM      Result Value Range   Sodium 132 (*) 135 - 145 mEq/L   Potassium 3.6  3.5 - 5.1 mEq/L   Chloride 99  96 - 112 mEq/L   CO2 23  19 - 32 mEq/L   Glucose, Bld 105 (*) 70 - 99 mg/dL   BUN 19  6 - 23 mg/dL   Creatinine, Ser 3.32  0.50 - 1.35 mg/dL   Calcium 8.9  8.4 - 95.1 mg/dL   Total Protein 7.1  6.0 - 8.3 g/dL   Albumin 3.0 (*) 3.5 - 5.2 g/dL   AST 82 (*) 0 - 37 U/L   ALT 122 (*) 0 - 53 U/L   Alkaline Phosphatase 190 (*) 39 - 117 U/L  Total Bilirubin 3.7 (*) 0.3 - 1.2 mg/dL   GFR calc non Af Amer >90  >90 mL/min   GFR calc Af Amer >90  >90 mL/min   Comment: (NOTE)     The eGFR has been calculated using the CKD EPI equation.     This calculation has not been validated in all clinical situations.     eGFR's persistently <90 mL/min signify possible Chronic Kidney     Disease.  LIPASE, BLOOD     Status: Abnormal   Collection Time    08/27/13  9:30 AM      Result Value Range   Lipase 1960 (*) 11 - 59 U/L  URINALYSIS, ROUTINE W REFLEX MICROSCOPIC     Status: Abnormal   Collection Time    08/27/13  9:46 AM      Result Value Range   Color, Urine ORANGE (*) YELLOW   Comment: BIOCHEMICALS MAY BE AFFECTED BY COLOR   APPearance CLEAR  CLEAR   Specific Gravity, Urine 1.031 (*) 1.005 - 1.030   pH 5.5  5.0 - 8.0   Glucose, UA NEGATIVE  NEGATIVE mg/dL   Hgb urine dipstick NEGATIVE  NEGATIVE   Bilirubin Urine MODERATE (*) NEGATIVE   Ketones, ur 15 (*) NEGATIVE mg/dL   Protein, ur 30 (*) NEGATIVE mg/dL   Urobilinogen, UA 2.0 (*) 0.0 - 1.0 mg/dL   Nitrite POSITIVE (*) NEGATIVE   Leukocytes, UA SMALL (*) NEGATIVE  URINE MICROSCOPIC-ADD ON     Status: Abnormal   Collection Time    08/27/13  9:46 AM      Result Value Range   Squamous Epithelial / LPF RARE  RARE   WBC, UA 0-2  <3 WBC/hpf   RBC / HPF 0-2  <3 RBC/hpf   Bacteria, UA FEW (*) RARE   Casts  HYALINE CASTS (*) NEGATIVE   Comment: GRANULAR CAST   Urine-Other MUCOUS PRESENT      US Abdomen Complete  08/27/2013   CLINICAL DATA:  Epigastric pain, elevated lipase  EXAM: ABDOMEN ULTRASOUND  COMPARISON:  CT abdomen 08/27/2013  FINDINGS: Gallbladder  Multiple shadowing calculi 1 in gallbladder. Mild gallbladder wall thickening. No sonographic Murphy sign. Question minimal pericholecystic fluid.  Common bile duct  Diameter: 4 mm diameter  Liver  Two calcified granulomata noted as seen on CT. Normal parenchymal echogenicity without mass.  IVC  Normal appearance  Pancreas  Obscured by bowel gas  Spleen  Normal-sized 10.1 cm length with calcified granuloma.  Right Kidney  Length: 12.5 cm  Normal morphology without mass or hydronephrosis.  Left Kidney  Length: 12.0 cm  Normal morphology without mass or hydronephrosis.  Abdominal aorta  Portions obscured by bowel gas, with visualized portions normal caliber.  No free-fluid  IMPRESSION: Cholelithiasis with gallbladder wall thickening, cannot exclude acute cholecystitis.  Nonvisualization of pancreas and portions of aorta.  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   Electronically Signed   By: Ulyses Southward M.D.   On: 08/27/2013 16:38   Ct Abdomen Pelvis W Contrast  08/27/2013   *RADIOLOGY REPORT*  Clinical Data: Abdomen and back pain for several days history of prior bowel resection for benign lesion  CT ABDOMEN AND PELVIS WITH CONTRAST  Technique:  Multidetector CT imaging of the abdomen and pelvis was performed following the standard protocol during bolus administration of intravenous contrast.  Contrast: OMNIPAQUE IOHEXOL 300 MG/ML  SOLN  Comparison: None.  Findings: The lung bases are clear.  The liver enhances with no  focal abnormality and no ductal dilatation is noted.  A few small calcified hepatic granulomas are present.  The gallbladder is visualized and no calcified gallstones are seen.  The pancreas is normal in size and the pancreatic  duct is not dilated.  The adrenal glands are symmetrical and unremarkable, and several calcified splenic granulomas are present consistent with prior granulomatous disease.  The kidneys enhance with no calculus or mass and on delayed images, the pelvocaliceal systems are unremarkable.  The proximal ureters are normal in caliber.  The abdominal aorta is normal in caliber.  No adenopathy is seen.  A periumbilical hernia is present containing primarily fat.  The urinary bladder is unremarkable and moderately distended.  The prostate is normal in size.  Fat enters inguinal canals right greater than left.  No abnormality of the colon is seen.  The cecum is noted to be just beneath the tail of the right lobe liver. Degenerative change is noted throughout the facet joints of the lower lumbar spine.  IMPRESSION:  1.  No explanation for the patient's abdominal pain is seen. 2.  Changes of prior granulomatous disease with calcified hepatic and splenic granulomas. 3.  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   Original Report Authenticated By: Dwyane Dee, M.D.    Review of Systems  Constitutional: Negative.   HENT: Negative.   Eyes: Negative.   Respiratory: Negative.   Cardiovascular: Negative.   Gastrointestinal: Positive for nausea. Negative for vomiting and blood in stool.  Genitourinary: Negative.   Musculoskeletal: Negative.   Skin: Negative.   Neurological: Negative.   Endo/Heme/Allergies: Negative.   Psychiatric/Behavioral: Negative.    Blood pressure 121/69, pulse 58, temperature 98.5 F (36.9 C), temperature source Oral, resp. rate 20, height 6\' 4"  (1.93 m), weight 131.543 kg (290 lb), SpO2 99.00%. Physical Exam  Constitutional: He is oriented to person, place, and time. He appears well-developed and well-nourished.  HENT:  Head: Normocephalic and atraumatic.  Eyes: Conjunctivae and EOM are normal. Pupils are equal, round, and reactive to light.  Neck: Normal  range of motion. Neck supple.  Cardiovascular: Normal rate, regular rhythm and normal heart sounds.   Respiratory: Effort normal and breath sounds normal.  GI: Soft. Normal appearance. Bowel sounds are decreased. There is tenderness in the right upper quadrant and epigastric area. There is positive Murphy's sign.    Genitourinary: Penis normal.  Musculoskeletal: Normal range of motion.  Neurological: He is alert and oriented to person, place, and time. He has normal reflexes.  Skin: Skin is warm and dry.  Psychiatric: He has a normal mood and affect. His behavior is normal. Judgment and thought content normal.    Assessment/Plan: Gallstone pancreatitis without complications.  Admit to [facility] for surgery in the near future when his lipase has normalized.  Actually medicine was to admit this patient, but without significant medical comorbidities, and a very straightforward surgical problem, I asked to have the patient admitted to the [facility] MD service.  Cherylynn Ridges 08/27/2013, 5:25 PM

## 2013-08-28 ENCOUNTER — Observation Stay (HOSPITAL_COMMUNITY): Payer: 59 | Admitting: Anesthesiology

## 2013-08-28 ENCOUNTER — Encounter (HOSPITAL_COMMUNITY): Payer: Self-pay | Admitting: Anesthesiology

## 2013-08-28 ENCOUNTER — Observation Stay (HOSPITAL_COMMUNITY): Payer: 59

## 2013-08-28 ENCOUNTER — Other Ambulatory Visit: Payer: 59

## 2013-08-28 ENCOUNTER — Encounter (HOSPITAL_COMMUNITY)
Admission: EM | Disposition: A | Discharge status: Home / Self Care | Payer: Self-pay | Source: Home / Self Care | Attending: Emergency Medicine

## 2013-08-28 DIAGNOSIS — K8010 Calculus of gallbladder with chronic cholecystitis without obstruction: Secondary | ICD-10-CM

## 2013-08-28 HISTORY — PX: CHOLECYSTECTOMY: SHX55

## 2013-08-28 LAB — COMPREHENSIVE METABOLIC PANEL
BUN: 13 mg/dL (ref 6–23)
CO2: 25 mEq/L (ref 19–32)
Calcium: 8.5 mg/dL (ref 8.4–10.5)
Chloride: 102 mEq/L (ref 96–112)
Creatinine, Ser: 0.88 mg/dL (ref 0.50–1.35)
GFR calc Af Amer: >90 mL/min (ref >90)
GFR calc non Af Amer: >90 mL/min (ref >90)
Glucose, Bld: 82 mg/dL (ref 70–99)
Total Bilirubin: 3.1 mg/dL — ABNORMAL HIGH (ref 0.3–1.2)

## 2013-08-28 LAB — SURGICAL PCR SCREEN
MRSA, PCR: NEGATIVE
Staphylococcus aureus: POSITIVE — AB

## 2013-08-28 LAB — CBC
Hemoglobin: 12.5 g/dL — ABNORMAL LOW (ref 13.0–17.0)
MCH: 27.6 pg (ref 26.0–34.0)
Platelets: 168 10*3/uL (ref 150–400)
RBC: 4.53 MIL/uL (ref 4.22–5.81)
WBC: 5.6 10*3/uL (ref 4.0–10.5)

## 2013-08-28 LAB — URINE CULTURE: Special Requests: NORMAL

## 2013-08-28 LAB — LIPASE, BLOOD: Lipase: 1089 U/L — ABNORMAL HIGH (ref 11–59)

## 2013-08-28 SURGERY — LAPAROSCOPIC CHOLECYSTECTOMY WITH INTRAOPERATIVE CHOLANGIOGRAM
Anesthesia: General | Site: Abdomen | Wound class: Clean Contaminated

## 2013-08-28 MED ORDER — HYDROCODONE-ACETAMINOPHEN 5-325 MG PO TABS: 1.0000 | ORAL_TABLET | ORAL | Status: DC | PRN

## 2013-08-28 MED ORDER — CIPROFLOXACIN IN D5W 400 MG/200ML IV SOLN
400.0000 mg | Freq: Once | INTRAVENOUS | Status: AC
  Administered 2013-08-28: 400 mg via INTRAVENOUS
  Filled 2013-08-28 (×2): qty 200

## 2013-08-28 MED ORDER — SUCCINYLCHOLINE CHLORIDE 20 MG/ML IJ SOLN
INTRAMUSCULAR | Status: DC | PRN
  Administered 2013-08-28: 140 mg via INTRAVENOUS

## 2013-08-28 MED ORDER — SODIUM CHLORIDE 0.9 % IR SOLN
Status: DC | PRN
  Administered 2013-08-28: 1

## 2013-08-28 MED ORDER — NEOSTIGMINE METHYLSULFATE 1 MG/ML IJ SOLN
INTRAMUSCULAR | Status: DC | PRN
  Administered 2013-08-28: 5 mg via INTRAVENOUS

## 2013-08-28 MED ORDER — FENTANYL CITRATE 0.05 MG/ML IJ SOLN
INTRAMUSCULAR | Status: DC | PRN
  Administered 2013-08-28: 150 ug via INTRAVENOUS
  Administered 2013-08-28: 50 ug via INTRAVENOUS

## 2013-08-28 MED ORDER — ARTIFICIAL TEARS OP OINT
TOPICAL_OINTMENT | OPHTHALMIC | Status: DC | PRN
  Administered 2013-08-28: 1 via OPHTHALMIC

## 2013-08-28 MED ORDER — HYDROMORPHONE HCL PF 1 MG/ML IJ SOLN: 0.2500 mg | INTRAMUSCULAR | Status: DC | PRN

## 2013-08-28 MED ORDER — BUPIVACAINE-EPINEPHRINE PF 0.25-1:200000 % IJ SOLN
INTRAMUSCULAR | Status: AC
  Filled 2013-08-28: qty 30

## 2013-08-28 MED ORDER — LACTATED RINGERS IV SOLN
INTRAVENOUS | Status: DC
  Administered 2013-08-28: 09:00:00 via INTRAVENOUS

## 2013-08-28 MED ORDER — OXYCODONE HCL 5 MG PO TABS: 5.0000 mg | ORAL_TABLET | Freq: Once | ORAL | Status: DC | PRN

## 2013-08-28 MED ORDER — DEXAMETHASONE SODIUM PHOSPHATE 4 MG/ML IJ SOLN
INTRAMUSCULAR | Status: DC | PRN
  Administered 2013-08-28: 8 mg via INTRAVENOUS

## 2013-08-28 MED ORDER — METOCLOPRAMIDE HCL 5 MG/ML IJ SOLN: 10.0000 mg | Freq: Once | INTRAMUSCULAR | Status: DC | PRN

## 2013-08-28 MED ORDER — MORPHINE SULFATE 4 MG/ML IJ SOLN: 4.0000 mg | INTRAMUSCULAR | Status: DC | PRN

## 2013-08-28 MED ORDER — BUPIVACAINE-EPINEPHRINE 0.25% -1:200000 IJ SOLN
INTRAMUSCULAR | Status: DC | PRN
  Administered 2013-08-28: 20 mL

## 2013-08-28 MED ORDER — GLYCOPYRROLATE 0.2 MG/ML IJ SOLN
INTRAMUSCULAR | Status: DC | PRN
  Administered 2013-08-28: .8 mg via INTRAVENOUS

## 2013-08-28 MED ORDER — LACTATED RINGERS IV SOLN
INTRAVENOUS | Status: DC | PRN
  Administered 2013-08-28 (×2): via INTRAVENOUS

## 2013-08-28 MED ORDER — OXYCODONE-ACETAMINOPHEN 5-325 MG PO TABS: 1.0000 | ORAL_TABLET | ORAL | Status: DC | PRN

## 2013-08-28 MED ORDER — SODIUM CHLORIDE 0.9 % IV SOLN
INTRAVENOUS | Status: DC | PRN
  Administered 2013-08-28: 11:00:00

## 2013-08-28 MED ORDER — 0.9 % SODIUM CHLORIDE (POUR BTL) OPTIME
TOPICAL | Status: DC | PRN
  Administered 2013-08-28: 1000 mL

## 2013-08-28 MED ORDER — ONDANSETRON HCL 4 MG/2ML IJ SOLN
INTRAMUSCULAR | Status: DC | PRN
  Administered 2013-08-28: 4 mg via INTRAVENOUS

## 2013-08-28 MED ORDER — PROPOFOL 10 MG/ML IV BOLUS
INTRAVENOUS | Status: DC | PRN
  Administered 2013-08-28: 200 mg via INTRAVENOUS

## 2013-08-28 MED ORDER — KETOROLAC TROMETHAMINE 30 MG/ML IJ SOLN
INTRAMUSCULAR | Status: DC | PRN
  Administered 2013-08-28: 30 mg via INTRAVENOUS

## 2013-08-28 MED ORDER — OXYCODONE HCL 5 MG/5ML PO SOLN: 5.0000 mg | Freq: Once | ORAL | Status: DC | PRN

## 2013-08-28 MED ORDER — MIDAZOLAM HCL 5 MG/5ML IJ SOLN
INTRAMUSCULAR | Status: DC | PRN
  Administered 2013-08-28: 2 mg via INTRAVENOUS

## 2013-08-28 MED ORDER — ROCURONIUM BROMIDE 100 MG/10ML IV SOLN
INTRAVENOUS | Status: DC | PRN
  Administered 2013-08-28: 30 mg via INTRAVENOUS

## 2013-08-28 MED ORDER — LIDOCAINE HCL (CARDIAC) 20 MG/ML IV SOLN
INTRAVENOUS | Status: DC | PRN
  Administered 2013-08-28: 80 mg via INTRAVENOUS

## 2013-08-28 SURGICAL SUPPLY — 38 items
APL SKNCLS STERI-STRIP NONHPOA (GAUZE/BANDAGES/DRESSINGS) ×1
APPLIER CLIP 5 13 M/L LIGAMAX5 (MISCELLANEOUS) ×2
APR CLP MED LRG 5 ANG JAW (MISCELLANEOUS) ×1
BAG SPEC RTRVL LRG 6X4 10 (ENDOMECHANICALS)
BANDAGE ADHESIVE 1X3 (GAUZE/BANDAGES/DRESSINGS) ×8 IMPLANT
BENZOIN TINCTURE PRP APPL 2/3 (GAUZE/BANDAGES/DRESSINGS) ×2 IMPLANT
CANISTER SUCTION 2500CC (MISCELLANEOUS) ×2 IMPLANT
CHLORAPREP W/TINT 26ML (MISCELLANEOUS) ×2 IMPLANT
CLIP APPLIE 5 13 M/L LIGAMAX5 (MISCELLANEOUS) ×1 IMPLANT
CLOTH BEACON ORANGE TIMEOUT ST (SAFETY) ×2 IMPLANT
COVER MAYO STAND STRL (DRAPES) IMPLANT
COVER SURGICAL LIGHT HANDLE (MISCELLANEOUS) ×2 IMPLANT
DECANTER SPIKE VIAL GLASS SM (MISCELLANEOUS) ×2 IMPLANT
DRAPE C-ARM 42X72 X-RAY (DRAPES) IMPLANT
ELECT REM PT RETURN 9FT ADLT (ELECTROSURGICAL) ×2
ELECTRODE REM PT RTRN 9FT ADLT (ELECTROSURGICAL) ×1 IMPLANT
GLOVE BIO SURGEON STRL SZ7.5 (GLOVE) ×1 IMPLANT
GLOVE BIOGEL PI IND STRL 7.5 (GLOVE) IMPLANT
GLOVE BIOGEL PI INDICATOR 7.5 (GLOVE) ×1
GLOVE SURG SIGNA 7.5 PF LTX (GLOVE) ×2 IMPLANT
GOWN STRL NON-REIN LRG LVL3 (GOWN DISPOSABLE) ×6 IMPLANT
GOWN STRL REIN XL XLG (GOWN DISPOSABLE) ×2 IMPLANT
KIT BASIN OR (CUSTOM PROCEDURE TRAY) ×2 IMPLANT
KIT ROOM TURNOVER OR (KITS) ×2 IMPLANT
NS IRRIG 1000ML POUR BTL (IV SOLUTION) ×2 IMPLANT
PAD ARMBOARD 7.5X6 YLW CONV (MISCELLANEOUS) ×2 IMPLANT
POUCH SPECIMEN RETRIEVAL 10MM (ENDOMECHANICALS) IMPLANT
SCISSORS LAP 5X35 DISP (ENDOMECHANICALS) IMPLANT
SET CHOLANGIOGRAPH 5 50 .035 (SET/KITS/TRAYS/PACK) IMPLANT
SET IRRIG TUBING LAPAROSCOPIC (IRRIGATION / IRRIGATOR) ×2 IMPLANT
SLEEVE ENDOPATH XCEL 5M (ENDOMECHANICALS) ×4 IMPLANT
SPECIMEN JAR SMALL (MISCELLANEOUS) ×2 IMPLANT
SUT MON AB 4-0 PC3 18 (SUTURE) ×2 IMPLANT
TOWEL OR 17X24 6PK STRL BLUE (TOWEL DISPOSABLE) ×2 IMPLANT
TOWEL OR 17X26 10 PK STRL BLUE (TOWEL DISPOSABLE) ×2 IMPLANT
TRAY LAPAROSCOPIC (CUSTOM PROCEDURE TRAY) ×2 IMPLANT
TROCAR XCEL BLUNT TIP 100MML (ENDOMECHANICALS) ×2 IMPLANT
TROCAR XCEL NON-BLD 5MMX100MML (ENDOMECHANICALS) ×2 IMPLANT

## 2013-08-28 NOTE — Anesthesia Preprocedure Evaluation (Signed)
Anesthesia Evaluation  Patient identified by MRN, date of birth, ID band Patient awake    Reviewed: Allergy & Precautions, H&P , NPO status , Patient's Chart, lab work & pertinent test results, reviewed documented beta blocker date and time   Airway Mallampati: II TM Distance: >3 FB Neck ROM: full    Dental   Pulmonary neg pulmonary ROS,  breath sounds clear to auscultation        Cardiovascular negative cardio ROS  Rhythm:regular     Neuro/Psych negative neurological ROS  negative psych ROS   GI/Hepatic negative GI ROS, Neg liver ROS,   Endo/Other  negative endocrine ROS  Renal/GU negative Renal ROS  negative genitourinary   Musculoskeletal   Abdominal   Peds  Hematology negative hematology ROS (+)   Anesthesia Other Findings See surgeon's H&P   Reproductive/Obstetrics negative OB ROS                           Anesthesia Physical Anesthesia Plan  ASA: II  Anesthesia Plan: General   Post-op Pain Management:    Induction: Intravenous  Airway Management Planned: Oral ETT  Additional Equipment:   Intra-op Plan:   Post-operative Plan: Extubation in OR  Informed Consent: I have reviewed the patients History and Physical, chart, labs and discussed the procedure including the risks, benefits and alternatives for the proposed anesthesia with the patient or authorized representative who has indicated his/her understanding and acceptance.   Dental Advisory Given  Plan Discussed with: CRNA and Surgeon  Anesthesia Plan Comments:         Anesthesia Quick Evaluation  

## 2013-08-28 NOTE — Anesthesia Postprocedure Evaluation (Signed)
Anesthesia Post Note  Patient: Larry Mcclure  Procedure(s) Performed: Procedure(s) (LRB): LAPAROSCOPIC CHOLECYSTECTOMY WITH INTRAOPERATIVE CHOLANGIOGRAM (N/A)  Anesthesia type: General  Patient location: PACU  Post pain: Pain level controlled  Post assessment: Patient's Cardiovascular Status Stable  Last Vitals:  Filed Vitals:   08/28/13 1300  BP: 129/67  Pulse: 55  Temp: 37.1 C  Resp: 18    Post vital signs: Reviewed and stable  Level of consciousness: alert  Complications: No apparent anesthesia complications

## 2013-08-28 NOTE — Progress Notes (Signed)
Patient ID: Larry Mcclure, male   DOB: 1961-07-17, 52 y.o.   MRN: [id number]  Doing well post op Will discharge home

## 2013-08-28 NOTE — Discharge Summary (Signed)
Physician Discharge Summary  Patient ID: Larry Mcclure MRN: [id number] DOB/AGE: [birth year] 52 y.o.  Admit date: 08/27/2013 Discharge date: 08/28/2013  Admission Diagnoses:  Discharge Diagnoses:  Principal Problem:   Acute pancreatitis Active Problems:   Cholelithiasis   Dehydration   Hx of right-sided colectomy   Discharged Condition: good  Hospital Course: admitted and taken to OR HD#1.  Did well post op so discharged home  Consults: None  Significant Diagnostic Studies:   Treatments: surgery: lap chole with IOC  Discharge Exam: Blood pressure 118/62, pulse 76, temperature 97.4 F (36.3 C), temperature source Oral, resp. rate 18, height 6\' 4"  (1.93 m), weight 281 lb 15.5 oz (127.9 kg), SpO2 97.00%. General appearance: alert, cooperative and no distress Resp: clear to auscultation bilaterally Cardio: regular rate and rhythm, S1, S2 normal, no murmur, click, rub or gallop Incision/Wound: clean  Disposition: 06-Home-Health Care Svc     Medication List         HYDROcodone-acetaminophen 5-325 MG per tablet  Commonly known as:  NORCO  Take 1-2 tablets by mouth every 4 (four) hours as needed for pain.     SAM-e 400 MG Tabs  Take 1 tablet by mouth daily.           Follow-up Information   Follow up with Shelly Rubenstein, MD. Call on 09/09/2013. (ask for [facility])    Specialty:  General Surgery   Contact information:   [address] [place] [address] [phone]       Signed: Shelly Rubenstein 08/28/2013, 5:06 PM

## 2013-08-28 NOTE — Consult Note (Signed)
We were asked by the ER to consult on this patient today gallstone pancreatitis. He apparently has rapidly improved and has already had laparoscopic cholecystectomy with normal IOC. At this point there does not seem to be anything for [place] to offer we will be available as needed. Please give [place] a call.

## 2013-08-28 NOTE — Progress Notes (Signed)
Pt given discharge instructions and prescriptions.  PIV removed.  Pt denied any questions or complaints.  Taken to drop off location via wheel chair.

## 2013-08-28 NOTE — Transfer of Care (Signed)
Immediate Anesthesia Transfer of Care Note  Patient: Larry Mcclure  Procedure(s) Performed: Procedure(s): LAPAROSCOPIC CHOLECYSTECTOMY WITH INTRAOPERATIVE CHOLANGIOGRAM (N/A)  Patient Location: PACU  Anesthesia Type:General  Level of Consciousness: awake, alert , oriented and patient cooperative  Airway & Oxygen Therapy: Patient Spontanous Breathing  Post-op Assessment: Report given to PACU RN, Post -op Vital signs reviewed and stable and Patient moving all extremities X 4  Post vital signs: Reviewed and stable  Complications: No apparent anesthesia complications

## 2013-08-28 NOTE — Op Note (Signed)
LAPAROSCOPIC CHOLECYSTECTOMY WITH INTRAOPERATIVE CHOLANGIOGRAM  Procedure Note  PERKINS MOLINA 08/27/2013 - 08/28/2013   Pre-op Diagnosis: gallstones pancreatitis     Post-op Diagnosis: 1. Gallstone pancreatitis                                   2. Incisional hernia  Procedure(s): LAPAROSCOPIC CHOLECYSTECTOMY WITH INTRAOPERATIVE CHOLANGIOGRAM  Surgeon(s): Shelly Rubenstein, MD  Anesthesia: General  Staff:  Circulator: Doy Mince, RN Radiology Technologist: Jacqulyn Ducking. Scrub Person: Lina Sayre, RN; Maureen Ralphs, RN Circulator Assistant: Vassie Moselle, RN  Estimated Blood Loss: Minimal               Specimens: sent to path          [facility]   Date: 08/28/2013  Time: 11:02 AM

## 2013-08-28 NOTE — Preoperative (Signed)
Beta Blockers   Reason not to administer Beta Blockers:Not Applicable 

## 2013-08-28 NOTE — Care Management Note (Signed)
    Page 1 of 1   08/29/2013     9:43:24 AM   CARE MANAGEMENT NOTE 08/29/2013  Patient:  Larry Mcclure, Larry Mcclure   Account Number:  [id number]  Date Initiated:  08/28/2013  Documentation initiated by:  Letha Cape  Subjective/Objective Assessment:   dx pancreatitis  admit- lives alone.     Action/Plan:   Anticipated DC Date:  08/29/2013   Anticipated DC Plan:  HOME/SELF CARE      DC Planning Services  CM consult      Choice offered to / List presented to:             Status of service:  Completed, signed off Medicare Important Message given?   (If response is "NO", the following Medicare IM given date fields will be blank) Date Medicare IM given:   Date Additional Medicare IM given:    Discharge Disposition:  HOME/SELF CARE  Per UR Regulation:  Reviewed for med. necessity/level of care/duration of stay  If discussed at Long Length of Stay Meetings, dates discussed:    Comments:  08/29/13 9:42 Letha Cape RN, BSN [phone] patient dc to home, no needs anticipated.  08/28/13 15:14 Letha Cape RN, BSN [phone] patient lives alone, patient is s/p lap chole, patient states MD says he can go home tomorrow,  patient is on clears today. Patient states he has medication coverage and transportation at dc. NCM will continue to follow for dc needs.

## 2013-08-28 NOTE — Op Note (Signed)
NAMEGUTHRIE, LEMME NO.:  [id number]  MEDICAL RECORD NO.:  [id number]  LOCATION:  [facility]                         FACILITY:  [facility]  PHYSICIAN:  Abigail Miyamoto, M.D. DATE OF BIRTH:  1961-12-16  DATE OF PROCEDURE:  08/28/2013 DATE OF DISCHARGE:                              OPERATIVE REPORT   PREOPERATIVE DIAGNOSIS:  Gallstone pancreatitis.  POSTOPERATIVE DIAGNOSIS: 1. Gallstone pancreatitis. 2. Incisional hernia.  PROCEDURE:  Laparoscopic cholecystectomy with intraoperative cholangiogram.  SURGEON:  Abigail Miyamoto, M.D.  ANESTHESIA:  General and 0.5% Marcaine.  ESTIMATED BLOOD LOSS:  Minimal.  INDICATIONS:  This is a 52 year old gentleman, who has had a previous laparoscopic right hemicolectomy, who presents with gallstone pancreatitis.  He quickly improved after admission, so decision was made to proceed to the operating room.  FINDINGS:  The patient was found to have a picking gallbladder and a mild amount of adhesions of colon to the gallbladder.  The cholangiogram was normal without evidence of obstructing of stone.  The patient also had a moderate-sized incisional hernia at the lower aspect of his previous midline incision.  PROCEDURE IN DETAIL:  The patient was brought to the operating room, identified as Larry Mcclure.  He was placed supine on the operating room table, and general anesthesia was induced.  His abdomen was then prepped and draped in usual sterile fashion.  I made a small incision with a scalpel in the left upper quadrant.  I then used the 5-mm trocar and Optiview camera to slowly traverse all layers of the abdominal wall under direct vision and gain entrance to the peritoneal cavity. Insufflation of the abdomen was then begun.  I examined the entrance site and saw no evidence of bowel injury.  I then placed an 11 mm port through the old scar above the umbilicus.  The patient had only 1 small adhesion of omentum to the lower  abdominal wall, but did have an incisional hernia defect at the lower aspect of the inferior end of the incision without evidence of any incarcerated bowel.  I placed a 5 mm port in the patient's epigastrium and 2 more in the right upper quadrant all under direct vision.  The gallbladder was then identified.  The colon was tethered to the gallbladder itself.  With slow blunt dissection as well as sharp dissection with the laparoscopic scissors, I was able to separate the colon from the gallbladder.  I examined the colon and saw no evidence of injury.  I then dissected the gallbladder down to its base.  I was able to identify the cystic duct and cystic artery and achieved critical window around it.  I clipped the artery several times proximally and distally.  I then clipped the cystic duct once distally and opened with laparoscopic scissors.  I placed a cholangiocatheter through a small incision in the right upper quadrant and placed this into the cystic duct under direct vision.  A cholangiogram was then performed.  Contrast flowed easily into the bile duct and duodenum without evidence of obstructing stone.  At this point, the cholangiocatheter was removed.  I clipped the cystic duct 3 times proximally and then transected the duct and the cystic  artery.  I then slowly dissected free the gallbladder from the liver bed with the electrocautery.  Once the gallbladder free from liver bed, it was placed in an endosac and removed through the incision at the umbilicus.  Again, the fascial defect was quite a ways from my umbilical port incision, so I elected not repair this at this time, as it required a very large incision defects.  I irrigated the abdomen with normal saline. Hemostasis appeared to be achieved.  I removed all ports under direct vision and deflated the abdomen.  I then closed the fascial defect above the umbilicus with 2 separate figure-of-eight 0 Vicryl sutures.  All incisions  were anesthetized with Marcaine and closed with 4-0 Monocryl and subcuticular sutures.  Steri-Strips and Band-Aids were then applied. The patient tolerated the procedure well.  All counts were correct at the end of the procedure.  The patient was then extubated in the operating room and taken in stable condition to recovery room.     Abigail Miyamoto, M.D.     DB/MEDQ  D:  08/28/2013  T:  08/28/2013  Job:  [id number]

## 2013-08-28 NOTE — Progress Notes (Signed)
Patient ID: Larry Mcclure, male   DOB: 1961/07/28, 52 y.o.   MRN: [id number]  He is significantly improved this morning He has minimal abdominal pain  On exam his vitals are normal and his abdomen is almost non tender  A/P: Gallstone pancreatitis  Will go ahead and proceed with Lap Chole and c-gram this morning.  I discussed this with him in detail.  I discussed the risks which include but are not limited to bleeding, infection, injury to surrounding structures, bile duct injury, bile leak, the need to convert to an open procedure, retained bile duct stones, etc.  He understands and agrees to proceed.

## 2013-08-29 ENCOUNTER — Telehealth (INDEPENDENT_AMBULATORY_CARE_PROVIDER_SITE_OTHER): Payer: Self-pay

## 2013-08-29 ENCOUNTER — Encounter (HOSPITAL_COMMUNITY): Payer: Self-pay | Admitting: Surgery

## 2013-08-29 ENCOUNTER — Encounter (INDEPENDENT_AMBULATORY_CARE_PROVIDER_SITE_OTHER): Payer: Self-pay | Admitting: General Surgery

## 2013-08-29 LAB — URINE CULTURE
Colony Count: NO GROWTH
Culture: NO GROWTH

## 2013-08-29 NOTE — Telephone Encounter (Signed)
Pt calling to schedule his post op appointment.  Dr. Magnus Ivan gave him instructions to ask for Pattricia Boss to schedule him to come in on 09/09/13.  He will also need a letter for his employer stating length of time time he will be out of work.  Please call him at [phone].

## 2013-09-04 NOTE — ED Provider Notes (Signed)
Medical screening examination/treatment/procedure(s) were performed by non-physician practitioner and as supervising physician I was immediately available for consultation/collaboration.  Raeford Razor, MD 09/04/13 1021

## 2013-09-09 ENCOUNTER — Encounter (INDEPENDENT_AMBULATORY_CARE_PROVIDER_SITE_OTHER): Payer: Self-pay | Admitting: Surgery

## 2013-09-09 ENCOUNTER — Ambulatory Visit (INDEPENDENT_AMBULATORY_CARE_PROVIDER_SITE_OTHER): Payer: 59 | Admitting: Surgery

## 2013-09-09 VITALS — BP 130/76 | HR 68 | Temp 97.3°F | Resp 16 | Ht 76.0 in | Wt 290.8 lb

## 2013-09-09 DIAGNOSIS — Z09 Encounter for follow-up examination after completed treatment for conditions other than malignant neoplasm: Secondary | ICD-10-CM

## 2013-09-09 NOTE — Progress Notes (Signed)
Subjective:     Patient ID: Larry Mcclure, male   DOB: 1961/05/04, 52 y.o.   MRN: [id number]  HPI He is here for his first postop visit status post laparoscopic cholecystectomy with cholangiogram for gallstone pancreatitis. At the time of surgery he was found to have a small incisional hernia. This could not be fixed at the time of surgery. He is currently doing well and has no complaints. He is eating well and moving his bowels well.  Review of Systems     Objective:   Physical Exam On exam, his incisions are well healed. There is an easily reducible hernia below the umbilicus at his old scar. The final pathology showed chronic cholecystitis gallstones    Assessment:     Patient stable postop     Plan:     He may resume his normal activity starting tomorrow.  I will see him back in 3 months to discuss repair of the hernia as it is currently asymptomatic

## 2013-12-31 ENCOUNTER — Emergency Department (HOSPITAL_COMMUNITY)
Admission: EM | Admit: 2013-12-31 | Discharge: 2013-12-31 | Disposition: A | Discharge status: Home / Self Care | Payer: Worker's Compensation | Attending: Emergency Medicine | Admitting: Emergency Medicine

## 2013-12-31 ENCOUNTER — Encounter (HOSPITAL_COMMUNITY): Payer: Self-pay | Admitting: Emergency Medicine

## 2013-12-31 DIAGNOSIS — IMO0002 Reserved for concepts with insufficient information to code with codable children: Secondary | ICD-10-CM | POA: Insufficient documentation

## 2013-12-31 DIAGNOSIS — S0100XA Unspecified open wound of scalp, initial encounter: Secondary | ICD-10-CM | POA: Insufficient documentation

## 2013-12-31 DIAGNOSIS — Y9301 Activity, walking, marching and hiking: Secondary | ICD-10-CM | POA: Insufficient documentation

## 2013-12-31 DIAGNOSIS — R51 Headache: Secondary | ICD-10-CM | POA: Insufficient documentation

## 2013-12-31 DIAGNOSIS — D126 Benign neoplasm of colon, unspecified: Secondary | ICD-10-CM | POA: Insufficient documentation

## 2013-12-31 DIAGNOSIS — S0101XA Laceration without foreign body of scalp, initial encounter: Secondary | ICD-10-CM

## 2013-12-31 DIAGNOSIS — Y9269 Other specified industrial and construction area as the place of occurrence of the external cause: Secondary | ICD-10-CM | POA: Insufficient documentation

## 2013-12-31 MED ORDER — TETANUS-DIPHTH-ACELL PERTUSSIS 5-2.5-18.5 LF-MCG/0.5 IM SUSP
0.5000 mL | Freq: Once | INTRAMUSCULAR | Status: AC
  Administered 2013-12-31: 0.5 mL via INTRAMUSCULAR
  Filled 2013-12-31: qty 0.5

## 2013-12-31 MED ORDER — NAPROXEN 500 MG PO TABS: 500.0000 mg | ORAL_TABLET | Freq: Two times a day (BID) | ORAL | Status: AC

## 2013-12-31 NOTE — Discharge Instructions (Signed)

## 2013-12-31 NOTE — ED Provider Notes (Signed)
CSN: [id number]     Arrival date & time 12/31/13  0259 History   First MD Initiated Contact with Patient 12/31/13 [phone]     Chief Complaint  Patient presents with  . Head Injury   (Consider location/radiation/quality/duration/timing/severity/associated sxs/prior Treatment) HPI Comments: 53 year old male who presents with a scalp laceration that occurred just prior to arrival. He was walking down the hallway, a piece of metal was hanging down, the top of his head struck a piece of metal causing a laceration. This was acute in onset, persistent, mild, associated with laceration and bleeding, improved with holding pressure and a dressing. No loss of consciousness, no excessive blood loss, no nausea or vomiting.  Patient is a 53 y.o. male presenting with head injury. The history is provided by the patient.  Head Injury Associated symptoms: headache ( mild)   Associated symptoms: no nausea and no vomiting       Past Medical History  Diagnosis Date  . Colon polyp    Past Surgical History  Procedure Laterality Date  . Tonsillectomy  1973  . Colon surgery      lap assisted right colectomy  . Cholecystectomy N/A 08/28/2013    Procedure: LAPAROSCOPIC CHOLECYSTECTOMY WITH INTRAOPERATIVE CHOLANGIOGRAM;  Surgeon: Harl Bowie, MD;  Location: [place];  Service: General;  Laterality: N/A;   Family History  Problem Relation Age of Onset  . COPD Mother   . Heart disease Mother   . Heart disease Father   . Cancer Sister     GI tumor   History  Substance Use Topics  . Smoking status: Never Smoker   . Smokeless tobacco: Never Used  . Alcohol Use: Yes     Comment: once per month    Review of Systems  Gastrointestinal: Negative for nausea and vomiting.  Skin: Positive for wound.  Neurological: Positive for headaches ( mild).    Allergies  Penicillins  Home Medications   Current Outpatient Rx  Name  Route  Sig  Dispense  Refill  . S-Adenosylmethionine (SAM-E) 400 MG TABS    Oral   Take 1 tablet by mouth daily.         . naproxen (NAPROSYN) 500 MG tablet   Oral   Take 1 tablet (500 mg total) by mouth 2 (two) times daily with a meal.   30 tablet   0    BP 147/69  Pulse 78  Temp(Src) 97.7 F (36.5 C) (Oral)  Resp 18  SpO2 97% Physical Exam  Constitutional: He appears well-developed and well-nourished. No distress.  HENT:  Head: Normocephalic.  5 cm laceration to the scalp  Eyes: Conjunctivae are normal. No scleral icterus.  Cardiovascular: Normal rate and regular rhythm.   Pulmonary/Chest: Effort normal and breath sounds normal.  Musculoskeletal: Normal range of motion. He exhibits tenderness ( ttp over the laceration site). He exhibits no edema.  Neurological: He is alert. Coordination normal.  Sensation and motor intact  Skin: Skin is warm and dry. He is not diaphoretic.  Laceration located on scalp The Laceration is linear shaped The depth is derms The length is 5cm    ED Course  Procedures (including critical care time) Labs Review Labs Reviewed - No data to display Imaging Review No results found.  EKG Interpretation   None       MDM   1. Laceration of scalp    Pt appears benign other than laceration - update tdap - primary repair after irrigation  LACERATION REPAIR Performed by: Noemi Chapel  D Authorized by: Noemi Chapel D Consent: Verbal consent obtained. Risks and benefits: risks, benefits and alternatives were discussed Consent given by: patient Patient identity confirmed: provided demographic data Prepped and Draped in normal sterile fashion Wound explored  Laceration Location: scalp  Laceration Length: 5 cm  No Foreign Bodies seen or palpated  Anesthesia: local infiltration  Local anesthetic: none  Irrigation method: syringe Amount of cleaning: standard  Skin closure: staple  Number of sutures: 5  Technique: staple  Patient tolerance: Patient tolerated the procedure well with no immediate  complications.   Meds given in ED:  Medications  Tdap (BOOSTRIX) injection 0.5 mL (not administered)    New Prescriptions   NAPROXEN (NAPROSYN) 500 MG TABLET    Take 1 tablet (500 mg total) by mouth 2 (two) times daily with a meal.        Johnna Acosta, MD 12/31/13 [phone]

## 2013-12-31 NOTE — ED Notes (Signed)
Pt. hit his head against a piece of metal while working at [employer] this evening , no LOC / alert and oriented. Presents with laceration at top scalp. Dressing applied prior to arrival.

## 2014-03-31 ENCOUNTER — Encounter (INDEPENDENT_AMBULATORY_CARE_PROVIDER_SITE_OTHER): Payer: Self-pay

## 2014-10-21 IMAGING — CT CT ABD-PELV W/ CM
2 of 5 series · 16 of 46 positions shown, 18 images · IV contrast (APPLIED)
Comparison: None.

CLINICAL DATA: Abdomen and back pain for several days history of
prior bowel resection for benign lesion

CT ABDOMEN AND PELVIS WITH CONTRAST
TECHNIQUE: Multidetector CT imaging of the abdomen and pelvis was
performed following the standard protocol during bolus
administration of intravenous contrast.
Contrast: 100mL OMNIPAQUE IOHEXOL 300 MG/ML  SOLN

[Series 2: abd/ pelvis 5.0 i30f 1 · axial · 0.87mm/px · z∈[-542,-88]mm · 13 of 103 slices shown, 15 images]
[im 6/103  soft-tissue]
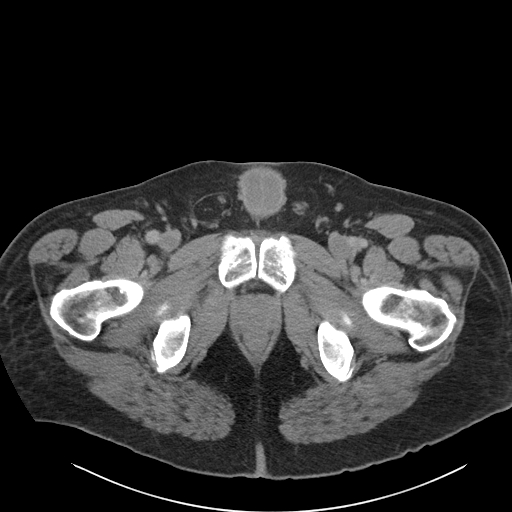
[im 6/103  bone]
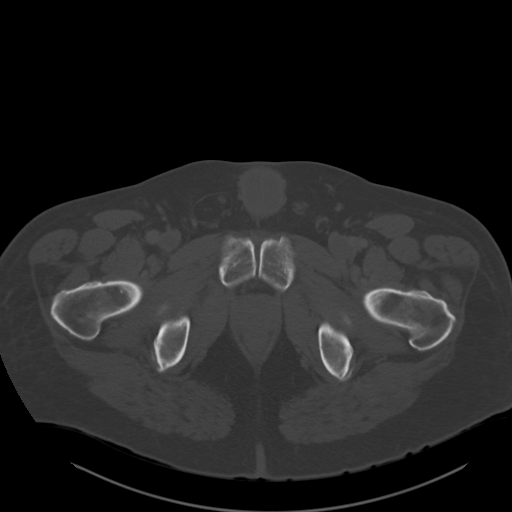
[im 16/103  soft-tissue]
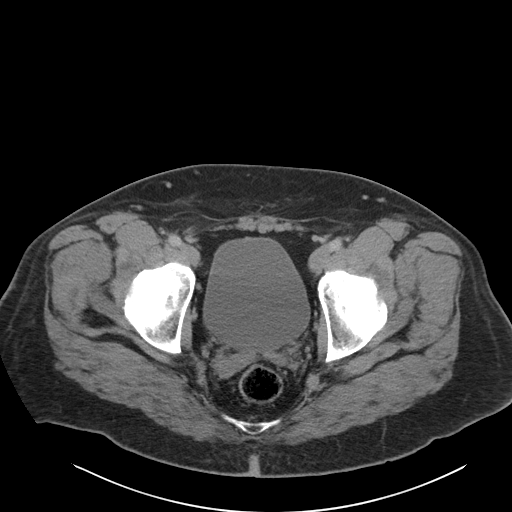
[im 21/103  soft-tissue]
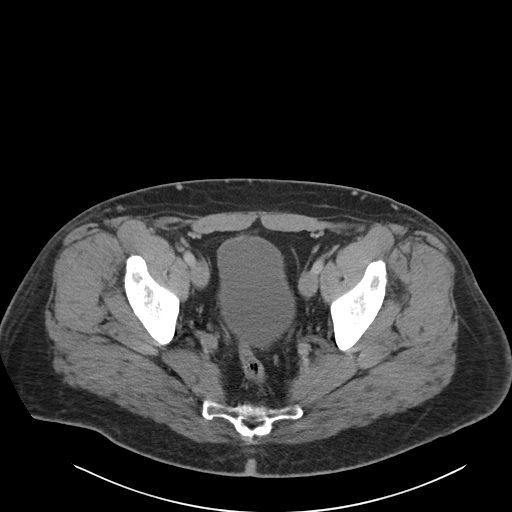
[im 31/103  soft-tissue]
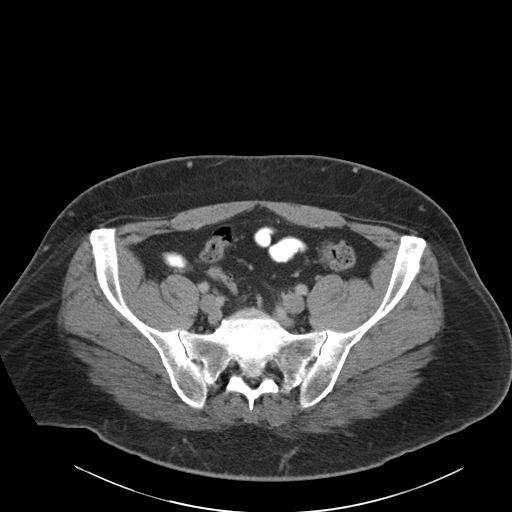
[im 36/103  soft-tissue]
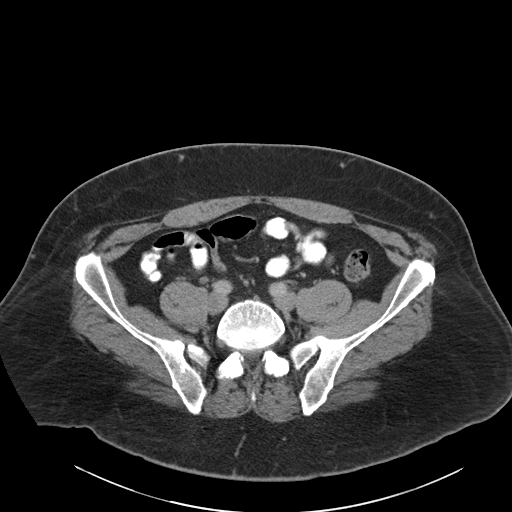
[im 46/103  soft-tissue]
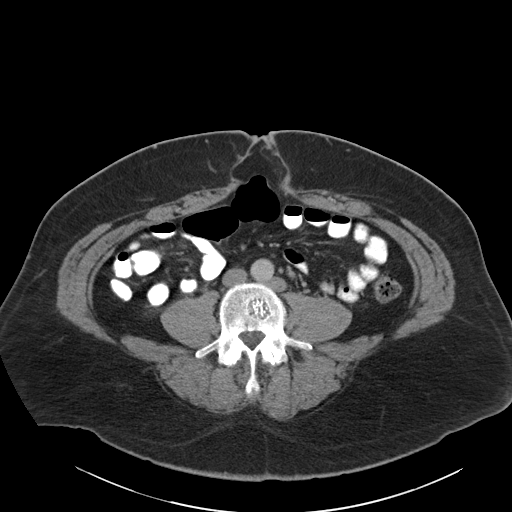
[im 52/103  soft-tissue]
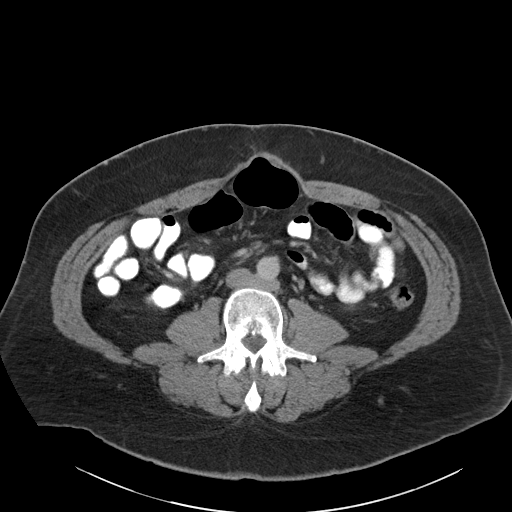
[im 57/103  soft-tissue]
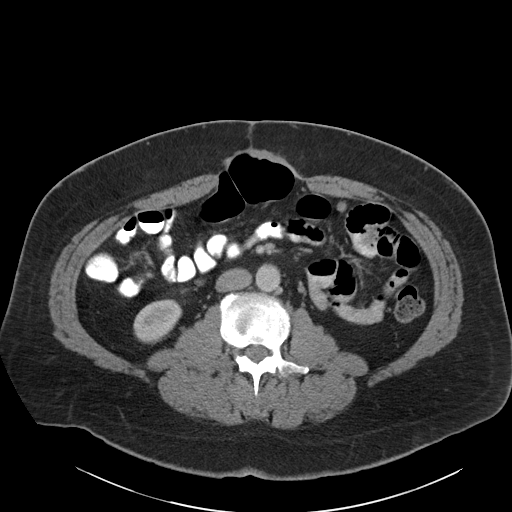
[im 67/103  soft-tissue]
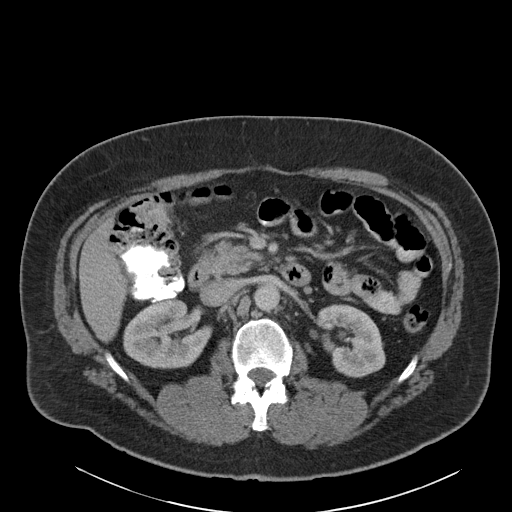
[im 67/103  bone]
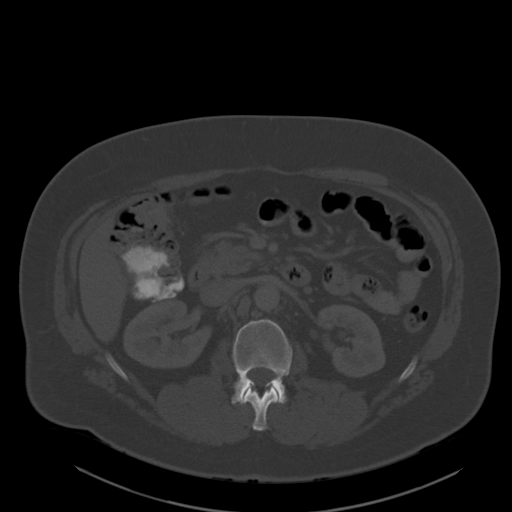
[im 72/103  soft-tissue]
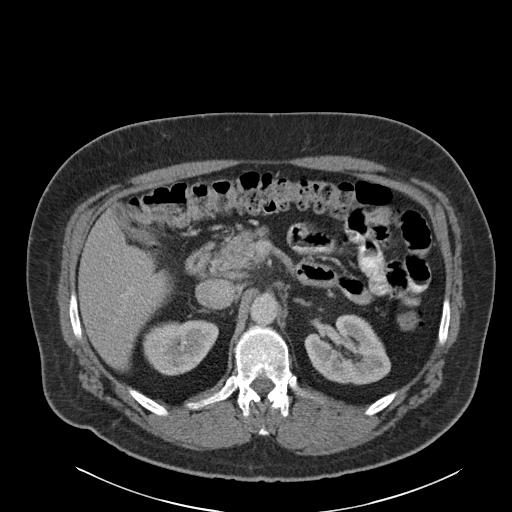
[im 82/103  soft-tissue]
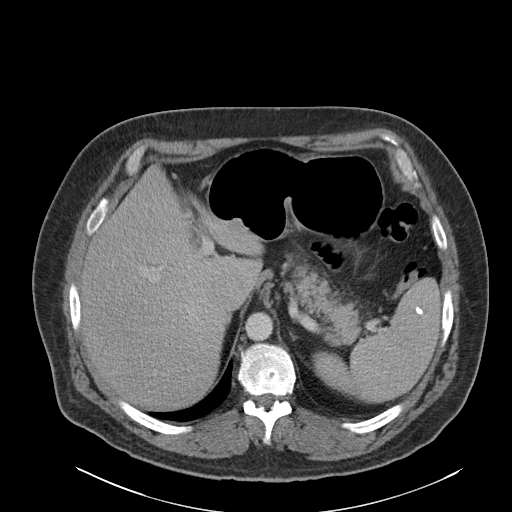
[im 87/103  soft-tissue]
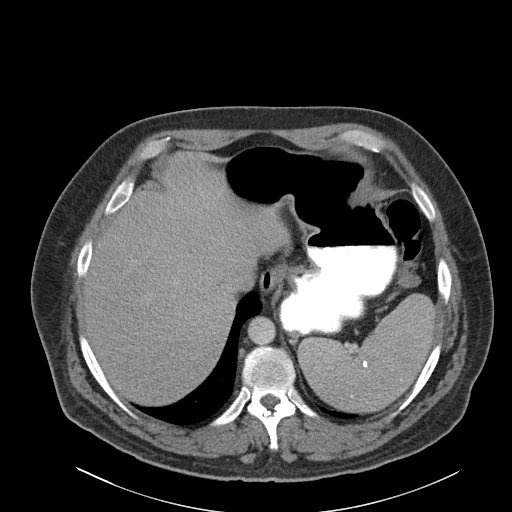
[im 97/103  soft-tissue]
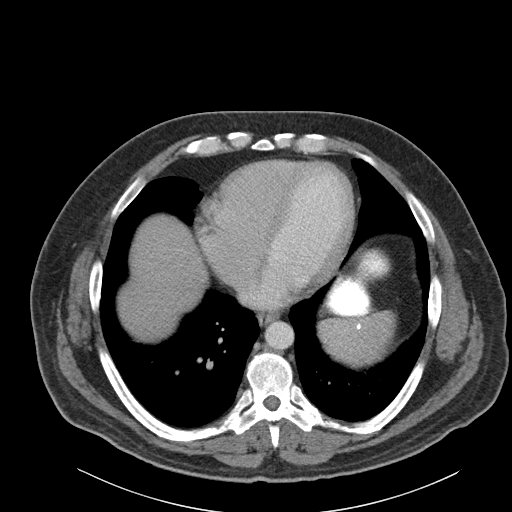

[Series 5: cor · coronal · 0.78mm/px · 3 of 143 slices shown]
[im 48/143  soft-tissue]
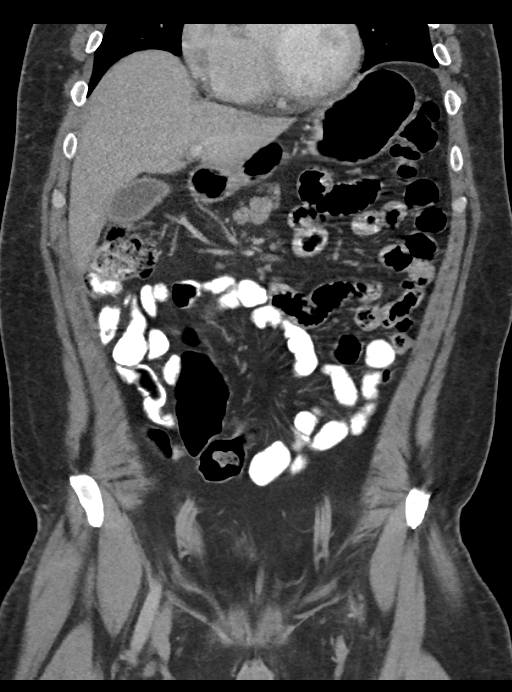
[im 64/143  soft-tissue]
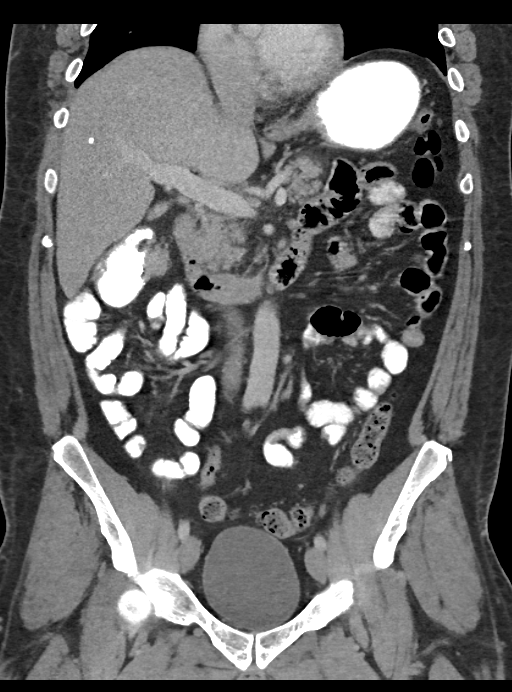
[im 79/143  soft-tissue]
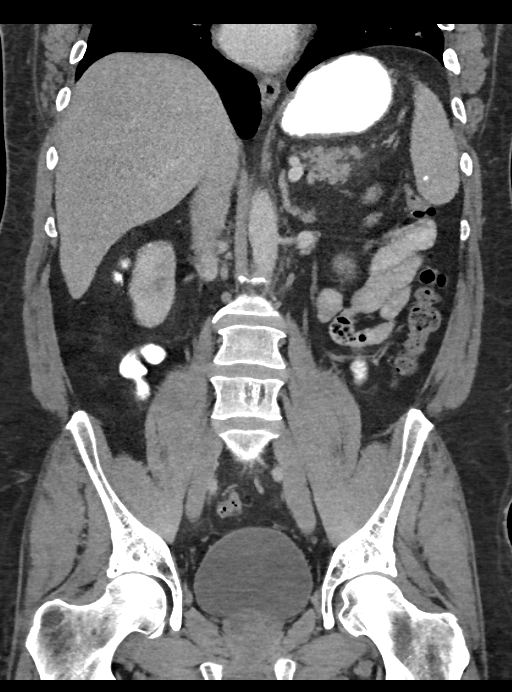

[16 of 46 positions shown; findings below may reference images not displayed]

FINDINGS: The lung bases are clear.  The liver enhances with no
focal abnormality and no ductal dilatation is noted.  A few small
calcified hepatic granulomas are present.  The gallbladder is
visualized and no calcified gallstones are seen.  The pancreas is
normal in size and the pancreatic duct is not dilated.  The adrenal
glands are symmetrical and unremarkable, and several calcified
splenic granulomas are present consistent with prior granulomatous
disease.  The kidneys enhance with no calculus or mass and on
delayed images, the pelvocaliceal systems are unremarkable.  The
proximal ureters are normal in caliber.  The abdominal aorta is
normal in caliber.  No adenopathy is seen.  A periumbilical hernia
is present containing primarily fat.

The urinary bladder is unremarkable and moderately distended.  The
prostate is normal in size.  Fat enters inguinal canals right
greater than left.  No abnormality of the colon is seen.  The cecum
is noted to be just beneath the tail of the right lobe liver.
Degenerative change is noted throughout the facet joints of the
lower lumbar spine.
IMPRESSION: 1.  No explanation for the patient's abdominal pain is seen.
2.  Changes of prior granulomatous disease with calcified hepatic
and splenic granulomas.
3.  Degenerative change throughout the facet joints of the lower
lumbar spine.

## 2015-02-28 ENCOUNTER — Encounter (HOSPITAL_COMMUNITY): Payer: Self-pay | Admitting: Emergency Medicine

## 2015-02-28 ENCOUNTER — Emergency Department (HOSPITAL_COMMUNITY)
Admission: EM | Admit: 2015-02-28 | Discharge: 2015-02-28 | Disposition: A | Discharge status: Home / Self Care | Payer: 59 | Attending: Emergency Medicine | Admitting: Emergency Medicine

## 2015-02-28 ENCOUNTER — Emergency Department (HOSPITAL_COMMUNITY): Payer: 59

## 2015-02-28 DIAGNOSIS — Z880 Allergy status to penicillin: Secondary | ICD-10-CM | POA: Diagnosis not present

## 2015-02-28 DIAGNOSIS — S40212A Abrasion of left shoulder, initial encounter: Secondary | ICD-10-CM | POA: Diagnosis not present

## 2015-02-28 DIAGNOSIS — Y999 Unspecified external cause status: Secondary | ICD-10-CM | POA: Diagnosis not present

## 2015-02-28 DIAGNOSIS — Z79899 Other long term (current) drug therapy: Secondary | ICD-10-CM | POA: Insufficient documentation

## 2015-02-28 DIAGNOSIS — Z791 Long term (current) use of non-steroidal anti-inflammatories (NSAID): Secondary | ICD-10-CM | POA: Diagnosis not present

## 2015-02-28 DIAGNOSIS — Y939 Activity, unspecified: Secondary | ICD-10-CM | POA: Diagnosis not present

## 2015-02-28 DIAGNOSIS — Y92410 Unspecified street and highway as the place of occurrence of the external cause: Secondary | ICD-10-CM | POA: Diagnosis not present

## 2015-02-28 DIAGNOSIS — S4992XA Unspecified injury of left shoulder and upper arm, initial encounter: Secondary | ICD-10-CM | POA: Diagnosis present

## 2015-02-28 DIAGNOSIS — S3992XA Unspecified injury of lower back, initial encounter: Secondary | ICD-10-CM | POA: Diagnosis not present

## 2015-02-28 DIAGNOSIS — Z86010 Personal history of colonic polyps: Secondary | ICD-10-CM | POA: Insufficient documentation

## 2015-02-28 DIAGNOSIS — M25512 Pain in left shoulder: Secondary | ICD-10-CM

## 2015-02-28 MED ORDER — IBUPROFEN 400 MG PO TABS
800.0000 mg | ORAL_TABLET | Freq: Once | ORAL | Status: AC
  Administered 2015-02-28: 800 mg via ORAL
  Filled 2015-02-28: qty 2

## 2015-02-28 MED ORDER — NAPROXEN 500 MG PO TABS: 500.0000 mg | ORAL_TABLET | Freq: Two times a day (BID) | ORAL | Status: AC
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
## 2015-02-28 NOTE — Discharge Instructions (Signed)
Please follow the directions provided.  Be sure to follow-up with the orthopedic doctor to ensure your are getting better.  Take Naproxen twice a day.  Use heat and ice on your shoulder for comfort.  Don't hesitate to return for any new, worsening or concerning symptoms.   SEEK IMMEDIATE MEDICAL CARE IF:  Your arm, hand, or fingers are numb or tingling.  Your arm, hand, or fingers are significantly swollen or turn white or blue.
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
## 2015-05-28 ENCOUNTER — Other Ambulatory Visit: Payer: Self-pay | Admitting: Surgery

## 2015-06-10 ENCOUNTER — Encounter (HOSPITAL_COMMUNITY): Payer: Self-pay

## 2015-06-10 NOTE — Patient Instructions (Signed)
YOUR PROCEDURE IS SCHEDULED ON : 06/15/15  REPORT TO [place] MAIN ENTRANCE FOLLOW SIGNS TO EAST ELEVATOR - GO TO 3rd FLOOR CHECK IN AT 3 EAST NURSES STATION (SHORT STAY) AT:  5:30 AM  CALL THIS NUMBER IF YOU HAVE PROBLEMS THE MORNING OF SURGERY [phone]  REMEMBER:ONLY 1 PER PERSON MAY GO TO SHORT STAY WITH YOU TO GET READY THE MORNING OF YOUR SURGERY  DO NOT EAT FOOD OR DRINK LIQUIDS AFTER MIDNIGHT  TAKE THESE MEDICINES THE MORNING OF SURGERY:  STOP ASPIRIN / IBUPROFEN / ALEVE / VITAMINS / HERBAL MEDS __5__ DAYS BEFORE SURGERY  YOU MAY NOT HAVE ANY METAL ON YOUR BODY INCLUDING HAIR PINS AND PIERCING'S. DO NOT WEAR JEWELRY, MAKEUP, LOTIONS, POWDERS OR PERFUMES. DO NOT WEAR NAIL POLISH. DO NOT SHAVE 48 HRS PRIOR TO SURGERY. MEN MAY SHAVE FACE AND NECK.  DO NOT [place]. [facility] IS NOT RESPONSIBLE FOR VALUABLES.  CONTACTS, DENTURES OR PARTIALS MAY NOT BE WORN TO SURGERY. LEAVE SUITCASE IN CAR. CAN BE BROUGHT TO ROOM AFTER SURGERY.  PATIENTS DISCHARGED THE DAY OF SURGERY WILL NOT BE ALLOWED TO DRIVE HOME.  PLEASE READ OVER THE FOLLOWING INSTRUCTION SHEETS _________________________________________________________________________________                                          [place] [facility] - PREPARING FOR SURGERY  Before surgery, you can play an important role.  Because skin is not sterile, your skin needs to be as free of germs as possible.  You can reduce the number of germs on your skin by washing with CHG (chlorahexidine gluconate) soap before surgery.  CHG is an antiseptic cleaner which kills germs and bonds with the skin to continue killing germs even after washing. Please DO NOT use if you have an allergy to CHG or antibacterial soaps.  If your skin becomes reddened/irritated stop using the CHG and inform your nurse when you arrive at Short Stay. Do not shave (including legs and underarms) for at least 48 hours prior to the first CHG  shower.  You may shave your face. Please follow these instructions carefully:   1.  Shower with CHG Soap the night before surgery and the  morning of Surgery.   2.  If you choose to wash your hair, wash your hair first as usual with your  normal  Shampoo.   3.  After you shampoo, rinse your hair and body thoroughly to remove the  shampoo.                                         4.  Use CHG as you would any other liquid soap.  You can apply chg directly  to the skin and wash . Gently wash with scrungie or clean wascloth    5.  Apply the CHG Soap to your body ONLY FROM THE NECK DOWN.   Do not use on open                           Wound or open sores. Avoid contact with eyes, ears mouth and genitals (private parts).  Genitals (private parts) with your normal soap.              6.  Wash thoroughly, paying special attention to the area where your surgery  will be performed.   7.  Thoroughly rinse your body with warm water from the neck down.   8.  DO NOT shower/wash with your normal soap after using and rinsing off  the CHG Soap .                9.  Pat yourself dry with a clean towel.             10.  Wear clean night clothes to bed after shower             11.  Place clean sheets on your bed the night of your first shower and do not  sleep with pets.  Day of Surgery : Do not apply any lotions/deodorants the morning of surgery.  Please wear clean clothes to the hospital/surgery center.  FAILURE TO FOLLOW THESE INSTRUCTIONS MAY RESULT IN THE CANCELLATION OF YOUR SURGERY    PATIENT SIGNATURE_________________________________  ______________________________________________________________________

## 2015-06-11 ENCOUNTER — Encounter (HOSPITAL_COMMUNITY)
Admission: RE | Admit: 2015-06-11 | Discharge: 2015-06-11 | Disposition: A | Discharge status: Home / Self Care | Payer: 59 | Source: Ambulatory Visit | Attending: Surgery | Admitting: Surgery

## 2015-06-11 ENCOUNTER — Encounter (HOSPITAL_COMMUNITY): Payer: Self-pay

## 2015-06-11 DIAGNOSIS — Z01818 Encounter for other preprocedural examination: Secondary | ICD-10-CM | POA: Insufficient documentation

## 2015-06-11 DIAGNOSIS — K432 Incisional hernia without obstruction or gangrene: Secondary | ICD-10-CM | POA: Diagnosis not present

## 2015-06-11 HISTORY — DX: Personal history of other diseases of the digestive system: Z87.19

## 2015-06-11 LAB — BASIC METABOLIC PANEL
ANION GAP: 7 (ref 5–15)
BUN: 23 mg/dL — ABNORMAL HIGH (ref 6–20)
CALCIUM: 8.6 mg/dL — AB (ref 8.9–10.3)
CO2: 25 mmol/L (ref 22–32)
CREATININE: 1.12 mg/dL (ref 0.61–1.24)
Chloride: 107 mmol/L (ref 101–111)
GFR calc Af Amer: >60 mL/min (ref >60)
Glucose, Bld: 89 mg/dL (ref 65–99)
Potassium: 4.6 mmol/L (ref 3.5–5.1)
Sodium: 139 mmol/L (ref 135–145)

## 2015-06-11 LAB — CBC
HEMATOCRIT: 43.2 % (ref 39.0–52.0)
Hemoglobin: 13.4 g/dL (ref 13.0–17.0)
MCH: 25.7 pg — ABNORMAL LOW (ref 26.0–34.0)
MCHC: 31 g/dL (ref 30.0–36.0)
MCV: 82.8 fL (ref 78.0–100.0)
PLATELETS: 183 10*3/uL (ref 150–400)
RBC: 5.22 MIL/uL (ref 4.22–5.81)
RDW: 15.1 % (ref 11.5–15.5)
WBC: 7.4 10*3/uL (ref 4.0–10.5)

## 2015-06-11 NOTE — Progress Notes (Signed)
   06/11/15 1012  OBSTRUCTIVE SLEEP APNEA  Have you ever been diagnosed with sleep apnea through a sleep study? No  Do you snore loudly (loud enough to be heard through closed doors)?  1  Do you often feel tired, fatigued, or sleepy during the daytime? 0  Has anyone observed you stop breathing during your sleep? 0  Do you have, or are you being treated for high blood pressure? 0  BMI more than 35 kg/m2? 1  Age over 55 years old? 1  Neck circumference greater than 40 cm/16 inches? 1  Gender: 1

## 2015-06-14 NOTE — H&P (Signed)
  arrell L. [facility] 05/28/2015 9:46 AM Location: [facility] Patient #: [id number] DOB: 12/17/1961 Single / Language: Cleophus Molt / Race: White Male  History of Present Illness (Tailor Westfall A. Ninfa Linden MD; 05/28/2015 9:52 AM) The patient is a 54 year old male who presents with an incisional hernia. This is a gentleman with a known incisional hernia. Now is having increasing symptoms from the hernia. He has discomfort but no obstructive symptoms. He has had a previous laparoscopic right partial colectomy as well as a lap Cholecystectomy. He is otherwise doing well.   Allergies Shirlean Mylar Gwynn, RMA; 05/28/2015 9:48 AM) Penicillin V *PENICILLINS*  Medication History Yehuda Mao, RMA; 05/28/2015 9:49 AM) Medications Reconciled  Vitals (Robin Gwynn RMA; 05/28/2015 9:50 AM) 05/28/2015 9:49 AM Weight: 327 lb Height: 74in Body Surface Area: 2.78 m Body Mass Index: 41.98 kg/m Temp.: 97.68F  Pulse: 64 (Regular)  BP: 140/90 (Sitting, Left Arm, Standard)    Physical Exam (Laritza Vokes A. Ninfa Linden MD; 05/28/2015 9:52 AM) The physical exam findings are as follows: Generally well in appearance Lungs clear bilaterally CV RRR Abdomen is soft.  there is an easily reducible incisional hernia at the umbilicus Skin without rash    Assessment & Plan (Hansen Carino A. Ninfa Linden MD; 05/28/2015 9:53 AM)   Fatima Blank HERNIA ([phone]  K43.2) Impression: We have discussed repair with him in the past and now he is ready to proceed with a left Incisional hernia repair with mesh. I discussed the surgery with him in detail.  These risks include but are not limited to bleeding, infection, injury to surrounding structures, the need to convert to an open procedure, DVT, cardiopulmonary issues, etc.  He agrees to proceed.  we will schedule surgery     Signed by Harl Bowie, MD (05/28/2015 9:54 AM)

## 2015-06-15 ENCOUNTER — Encounter (HOSPITAL_COMMUNITY): Payer: Self-pay | Admitting: *Deleted

## 2015-06-15 ENCOUNTER — Ambulatory Visit (HOSPITAL_COMMUNITY): Payer: 59 | Admitting: Certified Registered Nurse Anesthetist

## 2015-06-15 ENCOUNTER — Encounter (HOSPITAL_COMMUNITY)
Admission: RE | Disposition: A | Discharge status: Home / Self Care | Payer: Self-pay | Source: Ambulatory Visit | Attending: Surgery

## 2015-06-15 ENCOUNTER — Observation Stay (HOSPITAL_COMMUNITY)
Admission: RE | Admit: 2015-06-15 | Discharge: 2015-06-16 | Disposition: A | Discharge status: Home / Self Care | Payer: 59 | Source: Ambulatory Visit | Attending: Surgery | Admitting: Surgery

## 2015-06-15 DIAGNOSIS — Z6839 Body mass index (BMI) 39.0-39.9, adult: Secondary | ICD-10-CM | POA: Insufficient documentation

## 2015-06-15 DIAGNOSIS — K432 Incisional hernia without obstruction or gangrene: Secondary | ICD-10-CM | POA: Insufficient documentation

## 2015-06-15 DIAGNOSIS — Z9049 Acquired absence of other specified parts of digestive tract: Secondary | ICD-10-CM | POA: Insufficient documentation

## 2015-06-15 DIAGNOSIS — K913 Postprocedural intestinal obstruction: Secondary | ICD-10-CM | POA: Diagnosis not present

## 2015-06-15 DIAGNOSIS — R1013 Epigastric pain: Secondary | ICD-10-CM | POA: Diagnosis not present

## 2015-06-15 DIAGNOSIS — Z880 Allergy status to penicillin: Secondary | ICD-10-CM

## 2015-06-15 DIAGNOSIS — Z86010 Personal history of colonic polyps: Secondary | ICD-10-CM

## 2015-06-15 DIAGNOSIS — Z6841 Body Mass Index (BMI) 40.0 and over, adult: Secondary | ICD-10-CM

## 2015-06-15 HISTORY — PX: INCISIONAL HERNIA REPAIR: SHX193

## 2015-06-15 SURGERY — REPAIR, HERNIA, INCISIONAL, LAPAROSCOPIC
Anesthesia: General | Site: Abdomen

## 2015-06-15 MED ORDER — GLYCOPYRROLATE 0.2 MG/ML IJ SOLN
INTRAMUSCULAR | Status: DC | PRN
  Administered 2015-06-15: 0.6 mg via INTRAVENOUS

## 2015-06-15 MED ORDER — ROCURONIUM BROMIDE 100 MG/10ML IV SOLN
INTRAVENOUS | Status: AC
  Filled 2015-06-15: qty 1

## 2015-06-15 MED ORDER — CETYLPYRIDINIUM CHLORIDE 0.05 % MT LIQD
7.0000 mL | Freq: Two times a day (BID) | OROMUCOSAL | Status: DC
  Administered 2015-06-16: 7 mL via OROMUCOSAL

## 2015-06-15 MED ORDER — PROPOFOL 10 MG/ML IV BOLUS
INTRAVENOUS | Status: DC | PRN
  Administered 2015-06-15: 200 mg via INTRAVENOUS

## 2015-06-15 MED ORDER — LIDOCAINE HCL (CARDIAC) 20 MG/ML IV SOLN
INTRAVENOUS | Status: DC | PRN
  Administered 2015-06-15: 50 mg via INTRAVENOUS

## 2015-06-15 MED ORDER — HYDROMORPHONE HCL 2 MG/ML IJ SOLN
INTRAMUSCULAR | Status: AC
  Filled 2015-06-15: qty 1

## 2015-06-15 MED ORDER — ONDANSETRON HCL 4 MG/2ML IJ SOLN: 4.0000 mg | Freq: Four times a day (QID) | INTRAMUSCULAR | Status: DC | PRN

## 2015-06-15 MED ORDER — ONDANSETRON HCL 4 MG/2ML IJ SOLN
INTRAMUSCULAR | Status: DC | PRN
  Administered 2015-06-15: 4 mg via INTRAVENOUS

## 2015-06-15 MED ORDER — OXYCODONE-ACETAMINOPHEN 5-325 MG PO TABS
1.0000 | ORAL_TABLET | ORAL | Status: DC | PRN
  Administered 2015-06-15 – 2015-06-16 (×3): 1 via ORAL
  Filled 2015-06-15 (×3): qty 1

## 2015-06-15 MED ORDER — MIDAZOLAM HCL 2 MG/2ML IJ SOLN
INTRAMUSCULAR | Status: AC
  Filled 2015-06-15: qty 2

## 2015-06-15 MED ORDER — LACTATED RINGERS IV SOLN: INTRAVENOUS | Status: DC

## 2015-06-15 MED ORDER — 0.9 % SODIUM CHLORIDE (POUR BTL) OPTIME
TOPICAL | Status: DC | PRN
  Administered 2015-06-15: 1000 mL

## 2015-06-15 MED ORDER — FENTANYL CITRATE (PF) 250 MCG/5ML IJ SOLN
INTRAMUSCULAR | Status: AC
  Filled 2015-06-15: qty 5

## 2015-06-15 MED ORDER — PROPOFOL 10 MG/ML IV BOLUS
INTRAVENOUS | Status: AC
  Filled 2015-06-15: qty 20

## 2015-06-15 MED ORDER — POTASSIUM CHLORIDE IN NACL 20-0.9 MEQ/L-% IV SOLN
INTRAVENOUS | Status: DC
  Administered 2015-06-15 (×2): via INTRAVENOUS
  Filled 2015-06-15 (×3): qty 1000

## 2015-06-15 MED ORDER — HYDROMORPHONE HCL 1 MG/ML IJ SOLN
INTRAMUSCULAR | Status: DC | PRN
  Administered 2015-06-15 (×2): 1 mg via INTRAVENOUS

## 2015-06-15 MED ORDER — ONDANSETRON HCL 4 MG/2ML IJ SOLN
INTRAMUSCULAR | Status: AC
  Filled 2015-06-15: qty 2

## 2015-06-15 MED ORDER — SAM-E 400 MG PO TABS
1.0000 | ORAL_TABLET | Freq: Every morning | ORAL | Status: DC
Start: 2015-06-15 — End: 2015-06-15

## 2015-06-15 MED ORDER — BUPIVACAINE-EPINEPHRINE 0.25% -1:200000 IJ SOLN
INTRAMUSCULAR | Status: DC | PRN
  Administered 2015-06-15: 20 mL

## 2015-06-15 MED ORDER — LACTATED RINGERS IV SOLN
INTRAVENOUS | Status: DC | PRN
  Administered 2015-06-15: 07:00:00 via INTRAVENOUS

## 2015-06-15 MED ORDER — CIPROFLOXACIN IN D5W 400 MG/200ML IV SOLN
400.0000 mg | INTRAVENOUS | Status: AC
  Administered 2015-06-15: 400 mg via INTRAVENOUS

## 2015-06-15 MED ORDER — GLYCOPYRROLATE 0.2 MG/ML IJ SOLN
INTRAMUSCULAR | Status: AC
  Filled 2015-06-15: qty 3

## 2015-06-15 MED ORDER — HYDROMORPHONE HCL 1 MG/ML IJ SOLN: 0.2500 mg | INTRAMUSCULAR | Status: DC | PRN

## 2015-06-15 MED ORDER — SUCCINYLCHOLINE CHLORIDE 20 MG/ML IJ SOLN
INTRAMUSCULAR | Status: DC | PRN
Start: 2015-06-15 — End: 2015-06-15
  Administered 2015-06-15: 120 mg via INTRAVENOUS

## 2015-06-15 MED ORDER — HYDROMORPHONE HCL 1 MG/ML IJ SOLN
1.0000 mg | INTRAMUSCULAR | Status: DC | PRN
  Administered 2015-06-15: 1 mg via INTRAVENOUS
  Filled 2015-06-15: qty 1

## 2015-06-15 MED ORDER — ENOXAPARIN SODIUM 40 MG/0.4ML ~~LOC~~ SOLN
40.0000 mg | SUBCUTANEOUS | Status: DC
  Filled 2015-06-15 (×2): qty 0.4

## 2015-06-15 MED ORDER — NEOSTIGMINE METHYLSULFATE 10 MG/10ML IV SOLN
INTRAVENOUS | Status: DC | PRN
  Administered 2015-06-15: 3 mg via INTRAVENOUS

## 2015-06-15 MED ORDER — MIDAZOLAM HCL 5 MG/5ML IJ SOLN
INTRAMUSCULAR | Status: DC | PRN
  Administered 2015-06-15: 2 mg via INTRAVENOUS

## 2015-06-15 MED ORDER — ONDANSETRON HCL 4 MG PO TABS: 4.0000 mg | ORAL_TABLET | Freq: Four times a day (QID) | ORAL | Status: DC | PRN

## 2015-06-15 MED ORDER — LIDOCAINE HCL (CARDIAC) 20 MG/ML IV SOLN
INTRAVENOUS | Status: AC
  Filled 2015-06-15: qty 5

## 2015-06-15 MED ORDER — ROCURONIUM BROMIDE 100 MG/10ML IV SOLN
INTRAVENOUS | Status: DC | PRN
  Administered 2015-06-15: 30 mg via INTRAVENOUS

## 2015-06-15 MED ORDER — CIPROFLOXACIN IN D5W 400 MG/200ML IV SOLN
INTRAVENOUS | Status: AC
  Filled 2015-06-15: qty 200

## 2015-06-15 MED ORDER — BUPIVACAINE-EPINEPHRINE (PF) 0.25% -1:200000 IJ SOLN
INTRAMUSCULAR | Status: AC
  Filled 2015-06-15: qty 30

## 2015-06-15 MED ORDER — NEOSTIGMINE METHYLSULFATE 10 MG/10ML IV SOLN
INTRAVENOUS | Status: AC
  Filled 2015-06-15: qty 1

## 2015-06-15 MED ORDER — FENTANYL CITRATE (PF) 250 MCG/5ML IJ SOLN
INTRAMUSCULAR | Status: DC | PRN
  Administered 2015-06-15: 50 ug via INTRAVENOUS
  Administered 2015-06-15: 150 ug via INTRAVENOUS

## 2015-06-15 SURGICAL SUPPLY — 38 items
APPLIER CLIP 5 13 M/L LIGAMAX5 (MISCELLANEOUS)
APR CLP MED LRG 5 ANG JAW (MISCELLANEOUS)
BINDER ABDOMINAL 12 ML 46-62 (SOFTGOODS) IMPLANT
CABLE HIGH FREQUENCY MONO STRZ (ELECTRODE) ×3 IMPLANT
CHLORAPREP W/TINT 26ML (MISCELLANEOUS) ×3 IMPLANT
CLIP APPLIE 5 13 M/L LIGAMAX5 (MISCELLANEOUS) IMPLANT
CLOSURE WOUND 1/2 X4 (GAUZE/BANDAGES/DRESSINGS)
DECANTER SPIKE VIAL GLASS SM (MISCELLANEOUS) ×3 IMPLANT
DEVICE SECURE STRAP 25 ABSORB (INSTRUMENTS) ×4 IMPLANT
DEVICE TROCAR PUNCTURE CLOSURE (ENDOMECHANICALS) ×3 IMPLANT
DRAPE LAPAROSCOPIC ABDOMINAL (DRAPES) ×3 IMPLANT
ELECT REM PT RETURN 9FT ADLT (ELECTROSURGICAL) ×3
ELECTRODE REM PT RTRN 9FT ADLT (ELECTROSURGICAL) ×1 IMPLANT
GLOVE SURG SIGNA 7.5 PF LTX (GLOVE) ×3 IMPLANT
GOWN STRL REUS W/TWL XL LVL3 (GOWN DISPOSABLE) ×9 IMPLANT
KIT BASIN OR (CUSTOM PROCEDURE TRAY) ×3 IMPLANT
LIQUID BAND (GAUZE/BANDAGES/DRESSINGS) ×3 IMPLANT
MESH VENTRALIGHT ST 6X8 (Mesh Specialty) ×3 IMPLANT
MESH VENTRLGHT ELLIPSE 8X6XMFL (Mesh Specialty) IMPLANT
NDL SPNL 22GX3.5 QUINCKE BK (NEEDLE) ×1 IMPLANT
NEEDLE SPNL 22GX3.5 QUINCKE BK (NEEDLE) ×3 IMPLANT
PEN SKIN MARKING BROAD (MISCELLANEOUS) ×3 IMPLANT
SCISSORS LAP 5X35 DISP (ENDOMECHANICALS) IMPLANT
SET IRRIG TUBING LAPAROSCOPIC (IRRIGATION / IRRIGATOR) IMPLANT
SHEARS HARMONIC ACE PLUS 36CM (ENDOMECHANICALS) IMPLANT
SLEEVE XCEL OPT CAN 5 100 (ENDOMECHANICALS) ×3 IMPLANT
SOLUTION ANTI FOG 6CC (MISCELLANEOUS) ×1 IMPLANT
STRIP CLOSURE SKIN 1/2X4 (GAUZE/BANDAGES/DRESSINGS) IMPLANT
SUT MNCRL AB 4-0 PS2 18 (SUTURE) IMPLANT
SUT NOVA NAB DX-16 0-1 5-0 T12 (SUTURE) ×3 IMPLANT
TACKER 5MM HERNIA 3.5CML NAB (ENDOMECHANICALS) IMPLANT
TOWEL OR 17X26 10 PK STRL BLUE (TOWEL DISPOSABLE) ×3 IMPLANT
TOWEL OR NON WOVEN STRL DISP B (DISPOSABLE) ×3 IMPLANT
TRAY FOLEY W/METER SILVER 14FR (SET/KITS/TRAYS/PACK) ×3 IMPLANT
TRAY LAPAROSCOPIC (CUSTOM PROCEDURE TRAY) ×3 IMPLANT
TROCAR BLADELESS OPT 5 100 (ENDOMECHANICALS) ×3 IMPLANT
TROCAR XCEL NON-BLD 11X100MML (ENDOMECHANICALS) ×3 IMPLANT
TUBING INSUFFLATION 10FT LAP (TUBING) ×3 IMPLANT

## 2015-06-15 NOTE — Interval H&P Note (Signed)
History and Physical Interval Note: no change in H and P  06/15/2015 7:05 AM  Larry Mcclure  has presented today for surgery, with the diagnosis of Incisional Hernia  The various methods of treatment have been discussed with the patient and family. After consideration of risks, benefits and other options for treatment, the patient has consented to  Procedure(s): [place] (N/A) as a surgical intervention .  The patient's history has been reviewed, patient examined, no change in status, stable for surgery.  I have reviewed the patient's chart and labs.  Questions were answered to the patient's satisfaction.     Santoria Chason A

## 2015-06-15 NOTE — Progress Notes (Signed)
PHARMACIST - PHYSICIAN ORDER COMMUNICATION  CONCERNING: P&T Medication Policy on Herbal Medications  DESCRIPTION:  This patient's order for:  Sam-e  has been noted.  This product(s) is classified as an "herbal" or natural product. Due to a lack of definitive safety studies or FDA approval, nonstandard manufacturing practices, plus the potential risk of unknown drug-drug interactions while on inpatient medications, the Pharmacy and Therapeutics Committee does not permit the use of "herbal" or natural products of this type within [facility].   ACTION TAKEN: The pharmacy department is unable to verify this order at this time and your patient has been informed of this safety policy. Please reevaluate patient's clinical condition at discharge and address if the herbal or natural product(s) should be resumed at that time.  Dia Sitter, PharmD, BCPS 06/15/2015 9:42 AM

## 2015-06-15 NOTE — Anesthesia Preprocedure Evaluation (Addendum)
Anesthesia Evaluation  Patient identified by MRN, date of birth, ID band Patient awake    Reviewed: Allergy & Precautions, H&P , NPO status , Patient's Chart, lab work & pertinent test results  Airway Mallampati: II  TM Distance: >3 FB Neck ROM: full    Dental  (+) Dental Advisory Given, Chipped, Poor Dentition Front upper chipped/ broken:   Pulmonary neg pulmonary ROS,  breath sounds clear to auscultation  Pulmonary exam normal       Cardiovascular Exercise Tolerance: Good negative cardio ROS Normal cardiovascular examRhythm:regular Rate:Normal     Neuro/Psych negative neurological ROS  negative psych ROS   GI/Hepatic negative GI ROS, Neg liver ROS, History acute pancreatitis   Endo/Other  negative endocrine ROSMorbid obesity  Renal/GU negative Renal ROS  negative genitourinary   Musculoskeletal   Abdominal (+) + obese,   Peds  Hematology negative hematology ROS (+)   Anesthesia Other Findings   Reproductive/Obstetrics negative OB ROS                           Anesthesia Physical Anesthesia Plan  ASA: III  Anesthesia Plan: General   Post-op Pain Management:    Induction: Intravenous  Airway Management Planned: Oral ETT  Additional Equipment:   Intra-op Plan:   Post-operative Plan: Extubation in OR  Informed Consent: I have reviewed the patients History and Physical, chart, labs and discussed the procedure including the risks, benefits and alternatives for the proposed anesthesia with the patient or authorized representative who has indicated his/her understanding and acceptance.   Dental Advisory Given  Plan Discussed with: CRNA and Surgeon  Anesthesia Plan Comments:        Anesthesia Quick Evaluation

## 2015-06-15 NOTE — Op Note (Signed)
Larry Mcclure, Larry Mcclure NO.:  [id number]  MEDICAL RECORD NO.:  [id number]  LOCATION:  9371                         FACILITY:  [facility]  PHYSICIAN:  Coralie Keens, M.D. DATE OF BIRTH:  May 13, 1961  DATE OF PROCEDURE:  06/15/2015 DATE OF DISCHARGE:                              OPERATIVE REPORT   PREOPERATIVE DIAGNOSIS:  Incisional hernia.  POSTOPERATIVE DIAGNOSIS:  Incisional hernia.  PROCEDURE:  Laparoscopic incisional hernia repair with mesh.  SURGEON:  Coralie Keens, M.D.  ANESTHESIA:  General and 0.25% Marcaine.  ESTIMATED BLOOD LOSS:  Minimal.  FINDINGS:  The patient was found to have a fascial defect at the umbilicus through a previous midline incision.  It was repaired with a 15 cm x 20 cm Ventralight mesh from [employer].  PROCEDURE IN DETAIL:  The patient was brought to the operating room, identified as Larry Mcclure.  He was placed supine on the operating room table.  General anesthesia was induced.  His abdomen was then prepped and draped in the usual sterile fashion.  I made a small incision in the patient's left upper quadrant where there was a previous scar.  I then used a 5 mm trocar and Optiview camera to traverse all levels of the abdominal wall and gained entrance to the peritoneal cavity under direct vision.  I then examined the entrance site after insufflation was achieved and saw no evidence of bowel injury.  I placed an 11 mm port in the patient's left lower quadrant, a 5-mm port in the patient's right upper quadrant, and a 5-mm port in the patient's right lower quadrant, all under direct vision.  The patient had only a small band of omentum stuck into the hernia sac which was easily reduced.  I then measured the hernia defect in size.  There was weakness apparent with some facial splaying above the hernia in the upper midline as well.  I measured the hernia defect and then elected to use a 15 x 20 cm piece of Ventralight mesh from [employer].   This covered the fascial defect widely greater than 4 cm circumferentially.  I placed four separate #1 Novafil sutures into the mesh.  I then rolled the mesh out and soaked it in saline and placed it through the 11 mm trocar.  I then unrolled the mesh under direct vision.  I then made four separate skin incisions, and using the suture passer, pulled the sutures up to the abdominal wall.  I then tied these in place, pulling the mesh taut up against the peritoneal surface.  I then tacked the mesh circumferentially with absorbable tacks.  Again, wide coverage of the hernia defect with wide fascial overlap appeared to be achieved circumferentially.  I then again evaluated the bowel and saw no evidence of bleeding or bowel injury.  All trocars were then removed under direct vision.  The abdomen was deflated.  All incisions were then anesthetized with Marcaine and closed with 4-0 Monocryl subcuticular sutures.  Skin glue was then applied.  The patient tolerated the procedure well.  All the counts were correct at the end of the procedure.  The patient was then extubated in the operating room and taken  in stable condition to the recovery room.     Coralie Keens, M.D.     DB/MEDQ  D:  06/15/2015  T:  06/15/2015  Job:  [id number]

## 2015-06-15 NOTE — Anesthesia Procedure Notes (Signed)
Procedure Name: Intubation Date/Time: 06/15/2015 7:27 AM Performed by: Chandra Batch A Pre-anesthesia Checklist: Patient identified, Timeout performed, Emergency Drugs available, Suction available and Patient being monitored Patient Re-evaluated:Patient Re-evaluated prior to inductionOxygen Delivery Method: Circle system utilized Preoxygenation: Pre-oxygenation with 100% oxygen Intubation Type: IV induction Ventilation: Oral airway inserted - appropriate to patient size Laryngoscope Size: Mac and 4 Grade View: Grade I Tube type: Oral Tube size: 7.5 mm Number of attempts: 1 (DL by Corene Cornea EMT) Airway Equipment and Method: Stylet Placement Confirmation: ETT inserted through vocal cords under direct vision,  breath sounds checked- equal and bilateral and positive ETCO2 Secured at: 22 cm Tube secured with: Tape Dental Injury: Teeth and Oropharynx as per pre-operative assessment

## 2015-06-15 NOTE — Anesthesia Postprocedure Evaluation (Signed)
  Anesthesia Post-op Note  Patient: Larry Mcclure  Procedure(s) Performed: Procedure(s) (LRB): LAPAROSCOPIC INCISIONAL HERNIA REPAIR WITH MESH (N/A)  Patient Location: PACU  Anesthesia Type: General  Level of Consciousness: awake and alert   Airway and Oxygen Therapy: Patient Spontanous Breathing  Post-op Pain: mild  Post-op Assessment: Post-op Vital signs reviewed, Patient's Cardiovascular Status Stable, Respiratory Function Stable, Patent Airway and No signs of Nausea or vomiting  Last Vitals:  Filed Vitals:   06/15/15 1130  BP: 131/73  Pulse: 44  Temp: 36.6 C  Resp: 18    Post-op Vital Signs: stable   Complications: No apparent anesthesia complications

## 2015-06-15 NOTE — Transfer of Care (Signed)
Immediate Anesthesia Transfer of Care Note  Patient: Larry Mcclure  Procedure(s) Performed: Procedure(s): LAPAROSCOPIC INCISIONAL HERNIA REPAIR WITH MESH (N/A)  Patient Location: PACU  Anesthesia Type:General  Level of Consciousness: awake, alert  and oriented  Airway & Oxygen Therapy: Patient Spontanous Breathing and Patient connected to face mask oxygen  Post-op Assessment: Report given to RN and Post -op Vital signs reviewed and stable  Post vital signs: Reviewed and stable  Last Vitals:  Filed Vitals:   06/15/15 0524  BP: 114/74  Pulse: 66  Temp: 36.8 C  Resp: 18    Complications: No apparent anesthesia complications

## 2015-06-15 NOTE — Op Note (Signed)
LAPAROSCOPIC INCISIONAL HERNIA REPAIR WITH MESH  Procedure Note  TYLEN LEVERICH 06/15/2015   Pre-op Diagnosis: Incisional Hernia     Post-op Diagnosis: same  Procedure(s): LAPAROSCOPIC INCISIONAL HERNIA REPAIR WITH MESH  Surgeon(s): Coralie Keens, MD  Anesthesia: General  Staff:  Circulator: Maren Reamer, RN Scrub Person: Lowry Bowl; [facility]  Estimated Blood Loss: Minimal                         Anetta Olvera A   Date: 06/15/2015  Time: 8:18 AM

## 2015-06-16 ENCOUNTER — Encounter (HOSPITAL_COMMUNITY): Payer: Self-pay | Admitting: Surgery

## 2015-06-16 MED ORDER — HYDROCODONE-ACETAMINOPHEN 5-325 MG PO TABS: 1.0000 | ORAL_TABLET | ORAL | Status: AC | PRN

## 2015-06-16 NOTE — Discharge Instructions (Signed)
[facility] ______[facility] LAPAROSCOPIC SURGERY: POST OP INSTRUCTIONS Always review your discharge instruction sheet given to you by the facility where your surgery was performed. IF YOU HAVE DISABILITY OR FAMILY LEAVE FORMS, YOU MUST BRING THEM TO THE OFFICE FOR PROCESSING.   DO NOT GIVE THEM TO YOUR DOCTOR.  1. A prescription for pain medication may be given to you upon discharge.  Take your pain medication as prescribed, if needed.  If narcotic pain medicine is not needed, then you may take acetaminophen (Tylenol) or ibuprofen (Advil) as needed. 2. Take your usually prescribed medications unless otherwise directed. 3. If you need a refill on your pain medication, please contact your pharmacy.  They will contact our office to request authorization. Prescriptions will not be filled after 5pm or on week-ends. 4. You should follow a light diet the first few days after arrival home, such as soup and crackers, etc.  Be sure to include lots of fluids daily. 5. Most patients will experience some swelling and bruising in the area of the incisions.  Ice packs will help.  Swelling and bruising can take several days to resolve.  6. It is common to experience some constipation if taking pain medication after surgery.  Increasing fluid intake and taking a stool softener (such as Colace) will usually help or prevent this problem from occurring.  A mild laxative (Milk of Magnesia or Miralax) should be taken according to package instructions if there are no bowel movements after 48 hours. 7. Unless discharge instructions indicate otherwise, you may remove your bandages 24-48 hours after surgery, and you may shower at that time.  You may have steri-strips (small skin tapes) in place directly over the incision.  These strips should be left on the skin for 7-10 days.  If your surgeon used skin glue on the incision, you may shower in 24 hours.  The glue will flake off over the next 2-3 weeks.  Any sutures or  staples will be removed at the office during your follow-up visit. 8. ACTIVITIES:  You may resume regular (light) daily activities beginning the next day--such as daily self-care, walking, climbing stairs--gradually increasing activities as tolerated.  You may have sexual intercourse when it is comfortable.  Refrain from any heavy lifting or straining until approved by your doctor. a. You may drive when you are no longer taking prescription pain medication, you can comfortably wear a seatbelt, and you can safely maneuver your car and apply brakes. b. RETURN TO WORK:  __________________________________________________________ 9. You should see your doctor in the office for a follow-up appointment approximately 2-3 weeks after your surgery.  Make sure that you call for this appointment within a day or two after you arrive home to insure a convenient appointment time. 10. OTHER INSTRUCTIONS:NO LIFTING MORE THAN 15 TO 20 POUNDS FOR 3 TO 4 WEEKS __________________________________________________________________________________________________________________________ __________________________________________________________________________________________________________________________ WHEN TO CALL YOUR DOCTOR: 1. Fever over 101.0 2. Inability to urinate 3. Continued bleeding from incision. 4. Increased pain, redness, or drainage from the incision. 5. Increasing abdominal pain  The clinic staff is available to answer your questions during regular business hours.  Please dont hesitate to call and ask to speak to one of the nurses for clinical concerns.  If you have a medical emergency, go to the nearest emergency room or call 911.  A surgeon from [facility] is always on call at the hospital. [address], [place], [place], [place]  [address] ? [address] [address], [place], [place]  [address] [phone] ? [phone] ? FAX [phone] Web site: [email or web address]

## 2015-06-16 NOTE — Progress Notes (Signed)
Patient ID: Larry Mcclure, male   DOB: 1961-04-26, 54 y.o.   MRN: [id number]  Doing well Minimal pain abd soft  Discharge home

## 2015-06-16 NOTE — Discharge Summary (Signed)
Physician Discharge Summary  Patient ID: Larry Mcclure MRN: [id number] DOB/AGE: October 28, 1961 54 y.o.  Admit date: 06/15/2015 Discharge date: 06/16/2015  Admission Diagnoses:  Discharge Diagnoses:  Active Problems:   Incisional hernia   Discharged Condition: good  Hospital Course: uneventful post op recovery  Consults: None  Significant Diagnostic Studies:   Treatments: surgery: lap incisional hernia repair with mesh  Discharge Exam: Blood pressure 139/67, pulse 71, temperature 99.1 F (37.3 C), temperature source Oral, resp. rate 16, height 6\' 4"  (1.93 m), weight 148.78 kg (328 lb), SpO2 97 %. General appearance: alert, cooperative and no distress Resp: clear to auscultation bilaterally Cardio: regular rate and rhythm, S1, S2 normal, no murmur, click, rub or gallop Incision/Wound: abd soft, incisions clean  Disposition: 01-Home or Self Care     Medication List    TAKE these medications        acetaminophen 500 MG tablet  Commonly known as:  TYLENOL  Take 500-1,000 mg by mouth every 6 (six) hours as needed for headache.     HYDROcodone-acetaminophen 5-325 MG per tablet  Commonly known as:  NORCO  Take 1-2 tablets by mouth every 4 (four) hours as needed.     naproxen 500 MG tablet  Commonly known as:  NAPROSYN  Take 1 tablet (500 mg total) by mouth 2 (two) times daily with a meal.     naproxen 500 MG tablet  Commonly known as:  NAPROSYN  Take 1 tablet (500 mg total) by mouth 2 (two) times daily with a meal.     SAM-e 400 MG Tabs  Take 1 tablet by mouth every morning.           Follow-up Information    Follow up with [facility], MD. Schedule an appointment as soon as possible for a visit in 3 weeks.   Specialty:  General Surgery   Contact information:   [address] [place] [address] [phone]       Signed: Harl Bowie 06/16/2015, 7:27 AM

## 2015-06-16 NOTE — Progress Notes (Signed)
Pt with wife at bedside given d/c instructions-- when to call MD, f/u appts, s/s infection, prescription given--pt verablized understanding and asked appropriate questions.  Belongings packed, pt denies pain, d/c home with wife.

## 2015-06-17 ENCOUNTER — Encounter (HOSPITAL_COMMUNITY): Payer: Self-pay

## 2015-06-17 ENCOUNTER — Emergency Department (HOSPITAL_COMMUNITY): Payer: 59

## 2015-06-17 ENCOUNTER — Inpatient Hospital Stay (HOSPITAL_COMMUNITY)
Admission: EM | Admit: 2015-06-17 | Discharge: 2015-06-23 | DRG: 354 | Disposition: A | Discharge status: Home / Self Care | Payer: 59 | Attending: Surgery | Admitting: Surgery

## 2015-06-17 DIAGNOSIS — K567 Ileus, unspecified: Secondary | ICD-10-CM

## 2015-06-17 DIAGNOSIS — Z9049 Acquired absence of other specified parts of digestive tract: Secondary | ICD-10-CM | POA: Diagnosis present

## 2015-06-17 DIAGNOSIS — Z880 Allergy status to penicillin: Secondary | ICD-10-CM | POA: Diagnosis not present

## 2015-06-17 DIAGNOSIS — K432 Incisional hernia without obstruction or gangrene: Secondary | ICD-10-CM | POA: Diagnosis present

## 2015-06-17 DIAGNOSIS — Z6841 Body Mass Index (BMI) 40.0 and over, adult: Secondary | ICD-10-CM | POA: Diagnosis not present

## 2015-06-17 DIAGNOSIS — Z86010 Personal history of colonic polyps: Secondary | ICD-10-CM | POA: Diagnosis not present

## 2015-06-17 DIAGNOSIS — K9189 Other postprocedural complications and disorders of digestive system: Secondary | ICD-10-CM | POA: Diagnosis present

## 2015-06-17 DIAGNOSIS — K56609 Unspecified intestinal obstruction, unspecified as to partial versus complete obstruction: Secondary | ICD-10-CM

## 2015-06-17 DIAGNOSIS — R1013 Epigastric pain: Secondary | ICD-10-CM

## 2015-06-17 DIAGNOSIS — K913 Postprocedural intestinal obstruction: Secondary | ICD-10-CM | POA: Diagnosis present

## 2015-06-17 LAB — CBC WITH DIFFERENTIAL/PLATELET
BASOS ABS: 0 10*3/uL (ref 0.0–0.1)
Basophils Relative: 0 % (ref 0–1)
Eosinophils Absolute: 0 10*3/uL (ref 0.0–0.7)
Eosinophils Relative: 0 % (ref 0–5)
HEMATOCRIT: 45.4 % (ref 39.0–52.0)
Hemoglobin: 14.7 g/dL (ref 13.0–17.0)
LYMPHS ABS: 1 10*3/uL (ref 0.7–4.0)
Lymphocytes Relative: 8 % — ABNORMAL LOW (ref 12–46)
MCH: 25.7 pg — ABNORMAL LOW (ref 26.0–34.0)
MCHC: 32.4 g/dL (ref 30.0–36.0)
MCV: 79.2 fL (ref 78.0–100.0)
Monocytes Absolute: 1.7 10*3/uL — ABNORMAL HIGH (ref 0.1–1.0)
Monocytes Relative: 14 % — ABNORMAL HIGH (ref 3–12)
NEUTROS PCT: 78 % — AB (ref 43–77)
Neutro Abs: 9.3 10*3/uL — ABNORMAL HIGH (ref 1.7–7.7)
Platelets: 185 10*3/uL (ref 150–400)
RBC: 5.73 MIL/uL (ref 4.22–5.81)
RDW: 14.8 % (ref 11.5–15.5)
WBC: 12 10*3/uL — ABNORMAL HIGH (ref 4.0–10.5)

## 2015-06-17 LAB — COMPREHENSIVE METABOLIC PANEL
ALBUMIN: 3.8 g/dL (ref 3.5–5.0)
ALK PHOS: 70 U/L (ref 38–126)
ALT: 21 U/L (ref 17–63)
AST: 32 U/L (ref 15–41)
Anion gap: 12 (ref 5–15)
BUN: 24 mg/dL — AB (ref 6–20)
CALCIUM: 9.1 mg/dL (ref 8.9–10.3)
CO2: 22 mmol/L (ref 22–32)
Chloride: 98 mmol/L — ABNORMAL LOW (ref 101–111)
Creatinine, Ser: 1.19 mg/dL (ref 0.61–1.24)
GLUCOSE: 135 mg/dL — AB (ref 65–99)
POTASSIUM: 4.1 mmol/L (ref 3.5–5.1)
Sodium: 132 mmol/L — ABNORMAL LOW (ref 135–145)
TOTAL PROTEIN: 8.3 g/dL — AB (ref 6.5–8.1)
Total Bilirubin: 2.5 mg/dL — ABNORMAL HIGH (ref 0.3–1.2)

## 2015-06-17 MED ORDER — MORPHINE SULFATE 4 MG/ML IJ SOLN
4.0000 mg | Freq: Once | INTRAMUSCULAR | Status: AC
  Administered 2015-06-17: 4 mg via INTRAVENOUS
  Filled 2015-06-17: qty 1

## 2015-06-17 MED ORDER — SODIUM CHLORIDE 0.9 % IV SOLN
Freq: Once | INTRAVENOUS | Status: AC
  Administered 2015-06-17: 23:00:00 via INTRAVENOUS

## 2015-06-17 MED ORDER — ONDANSETRON HCL 4 MG/2ML IJ SOLN
4.0000 mg | Freq: Once | INTRAMUSCULAR | Status: AC
  Administered 2015-06-17: 4 mg via INTRAVENOUS
  Filled 2015-06-17: qty 2

## 2015-06-17 NOTE — ED Notes (Addendum)
Pt presents with c/o vomiting that started this morning. Pt reports he had hernia surgery on Tuesday of this week, no other complications since the surgery. Pt reports he has been able to pass gas since the surgery but has not had a bowel movement. Pt reports the vomiting is triggered by trying to eat or drink anything. Pt reports his vomit is green at this time. Called Dr. Lucia Gaskins and was told to come here and that he needed to be admitted.

## 2015-06-17 NOTE — ED Provider Notes (Signed)
CSN: [id number]     Arrival date & time 06/17/15  2124 History   First MD Initiated Contact with Patient 06/17/15 2209     Chief Complaint  Patient presents with  . Emesis  . Abdominal Pain     (Consider location/radiation/quality/duration/timing/severity/associated sxs/prior Treatment) Patient is a 54 y.o. male presenting with abdominal pain.  Abdominal Pain Pain location:  Epigastric Pain quality: bloating   Pain radiates to:  Does not radiate Pain severity:  Severe Onset quality:  Gradual Duration:  1 day Timing:  Constant Progression:  Worsening Chronicity:  New Context comment:  Hernia repair surgery 2 days ago Relieved by:  Nothing Exacerbated by: eating or drinking. Ineffective treatments:  None tried Associated symptoms: constipation, nausea and vomiting   Associated symptoms: no fever     Past Medical History  Diagnosis Date  . Colon polyp   . Hx of acute pancreatitis 2013    PRECHOLECYSTECTOMY   Past Surgical History  Procedure Laterality Date  . Tonsillectomy  1973  . Cholecystectomy N/A 08/28/2013    Procedure: LAPAROSCOPIC CHOLECYSTECTOMY WITH INTRAOPERATIVE CHOLANGIOGRAM;  Surgeon: Harl Bowie, MD;  Location: [place];  Service: General;  Laterality: N/A;  . Colon surgery  2012    lap assisted right colectomy  . Incisional hernia repair N/A 06/15/2015    Procedure: LAPAROSCOPIC INCISIONAL HERNIA REPAIR WITH MESH;  Surgeon: Coralie Keens, MD;  Location: [facility];  Service: General;  Laterality: N/A;   Family History  Problem Relation Age of Onset  . COPD Mother   . Heart disease Mother   . Heart disease Father   . Cancer Sister     GI tumor   History  Substance Use Topics  . Smoking status: Never Smoker   . Smokeless tobacco: Never Used  . Alcohol Use: Yes     Comment: once per month    Review of Systems  Constitutional: Negative for fever.  Gastrointestinal: Positive for nausea, vomiting, abdominal pain and constipation.  All other  systems reviewed and are negative.     Allergies  Penicillins  Home Medications   Prior to Admission medications   Medication Sig Start Date End Date Taking? Authorizing Provider  acetaminophen (TYLENOL) 500 MG tablet Take 500-1,000 mg by mouth every 6 (six) hours as needed for headache.    Historical Provider, MD  HYDROcodone-acetaminophen (NORCO) 5-325 MG per tablet Take 1-2 tablets by mouth every 4 (four) hours as needed. 06/16/15   Coralie Keens, MD  naproxen (NAPROSYN) 500 MG tablet Take 1 tablet (500 mg total) by mouth 2 (two) times daily with a meal. Patient not taking: Reported on 06/09/2015 12/31/13   Noemi Chapel, MD  naproxen (NAPROSYN) 500 MG tablet Take 1 tablet (500 mg total) by mouth 2 (two) times daily with a meal. Patient not taking: Reported on 06/09/2015 02/28/15   Britt Bottom, NP  S-Adenosylmethionine (SAM-E) 400 MG TABS Take 1 tablet by mouth every morning.     Historical Provider, MD   BP 131/76 mmHg  Pulse 86  Temp(Src) 98.2 F (36.8 C) (Oral)  Resp 18  SpO2 95% Physical Exam  Constitutional: He is oriented to person, place, and time. He appears well-developed and well-nourished. No distress.  HENT:  Head: Normocephalic and atraumatic.  Eyes: Conjunctivae are normal. No scleral icterus.  Neck: Neck supple.  Cardiovascular: Normal rate and intact distal pulses.   Pulmonary/Chest: Effort normal. No stridor. No respiratory distress.  Abdominal: Normal appearance. He exhibits distension. There is tenderness in the  epigastric area. There is no rigidity, no rebound and no guarding.  Lap surgical incisions clean and dry.  Neurological: He is alert and oriented to person, place, and time.  Skin: Skin is warm and dry. No rash noted.  Psychiatric: He has a normal mood and affect. His behavior is normal.  Nursing note and vitals reviewed.   ED Course  Procedures (including critical care time) Labs Review Labs Reviewed  CBC WITH DIFFERENTIAL/PLATELET -  Abnormal; Notable for the following:    WBC 12.0 (*)    MCH 25.7 (*)    Neutrophils Relative % 78 (*)    Neutro Abs 9.3 (*)    Lymphocytes Relative 8 (*)    Monocytes Relative 14 (*)    Monocytes Absolute 1.7 (*)    All other components within normal limits  COMPREHENSIVE METABOLIC PANEL  URINALYSIS, ROUTINE W REFLEX MICROSCOPIC (NOT AT [facility])    Imaging Review Dg Abd Acute W/chest  06/17/2015   CLINICAL DATA:  Abdominal pain  EXAM: DG ABDOMEN ACUTE W/ 1V CHEST  COMPARISON:  02/28/2015  FINDINGS: Cardiac shadow is mildly prominent. Increased left perihilar infiltrate is noted as well as some linear atelectasis in the right perihilar region. Elevation of left hemidiaphragm is seen.  The abdomen demonstrates multiple mildly dilated loops of small bowel with differential air-fluid levels consistent with obstructive process. Some gas is noted within the colon suggesting that this is partial in nature. Multiple splenic granulomas are seen. The osseous structures are within normal limits. No free air is noted.  IMPRESSION: Small bowel dilatation consistent with a small partial small bowel obstruction or small-bowel postoperative ileus. Correlation with physical exam is recommended.  Bilateral perihilar infiltrates.   Electronically Signed   By: Inez Catalina M.D.   On: 06/17/2015 22:53     EKG Interpretation None      MDM   Final diagnoses:  Epigastric pain    54 yo male with recent abdominal surgery presenting with bloating, pain, nausea, and vomiting.  Surgery to see and admit.     Serita Grit, MD 06/17/15 [phone]

## 2015-06-17 NOTE — ED Notes (Signed)
Pt offered PO zofran for nausea, declined any at this time.

## 2015-06-17 NOTE — ED Notes (Signed)
Pt had hernia repair on Tuesday, has multiple laparoscopic incisions that are intact, pt states today started having n/v, states also has not had BM since Tuesday morning. Pt states abdomen feels tight, was sent her by Dr. Lucia Gaskins.

## 2015-06-17 NOTE — ED Notes (Signed)
Pt states he is still unable to give urine sample 

## 2015-06-17 NOTE — H&P (Signed)
Re:   Larry Mcclure DOB:   1961/05/30 MRN:   [id number]  ASSESSMENT AND PLAN: 1.  Ileus vs bowel obstruction  Plan:  NPO, IVF, repeat labs and KUB in AM.  Will hold NGT for now, but will need one if he continues to vomit  2.  Recent lap ventral hernia  06/15/2015 - D. Blackman  3.  Obesity  Chief Complaint  Patient presents with  . Emesis  . Abdominal Pain   REFERRING PHYSICIAN: HEPLER,MARK, PA-C  HISTORY OF PRESENT ILLNESS: Larry Mcclure is a 54 y.o. (DOB: 09/25/1961)  white  male whose primary care physician is [facility], PA-C Sadie Haber at [facility] - Dr. Gaylyn Rong).  His wife, "Gwinda Passe" Hillel Card - MRN - [id number]), is at the bedside.  He comes to the [facility] with N&V over the last day.  He had a ventral hernia repair by Dr. Ninfa Linden on 06/15/2015 using a 15 x 20 cm piece of Ventralight mesh by [employer]. He has had a previous laparoscopic right partial colectomy (2012) as well as a lap Cholecystectomy Ninfa Linden - 08/2013).  He started vomiting this AM.  He tried taking some Miralax before he started vomiting for his bowels.  He has vomited about 6 times today.  He did have a prior history of pancreatitis - I think this is what prompted his lap chole.  His colon resection was for a benign polyp.  Dr. Michail Sermon has done his colonoscopies.  KUB - 06/17/2015 - ileus vs SBO WBC - 12,000 - 06/17/2015   Past Medical History  Diagnosis Date  . Colon polyp   . Hx of acute pancreatitis 2013    PRECHOLECYSTECTOMY      Past Surgical History  Procedure Laterality Date  . Tonsillectomy  1973  . Cholecystectomy N/A 08/28/2013    Procedure: LAPAROSCOPIC CHOLECYSTECTOMY WITH INTRAOPERATIVE CHOLANGIOGRAM;  Surgeon: Harl Bowie, MD;  Location: [place];  Service: General;  Laterality: N/A;  . Colon surgery  2012    lap assisted right colectomy  . Incisional hernia repair N/A 06/15/2015    Procedure: LAPAROSCOPIC INCISIONAL HERNIA REPAIR WITH MESH;  Surgeon: Coralie Keens, MD;  Location:  [facility];  Service: General;  Laterality: N/A;      No current facility-administered medications for this encounter.   Current Outpatient Prescriptions  Medication Sig Dispense Refill  . HYDROcodone-acetaminophen (NORCO) 5-325 MG per tablet Take 1-2 tablets by mouth every 4 (four) hours as needed. 25 tablet 0  . Ibuprofen-Diphenhydramine HCl (ADVIL PM) 200-25 MG CAPS Take 2 tablets by mouth daily as needed (pain).    Marland Kitchen acetaminophen (TYLENOL) 500 MG tablet Take 500-1,000 mg by mouth every 6 (six) hours as needed for headache.    . naproxen (NAPROSYN) 500 MG tablet Take 1 tablet (500 mg total) by mouth 2 (two) times daily with a meal. (Patient not taking: Reported on 06/09/2015) 30 tablet 0  . naproxen (NAPROSYN) 500 MG tablet Take 1 tablet (500 mg total) by mouth 2 (two) times daily with a meal. (Patient not taking: Reported on 06/09/2015) 30 tablet 1      Allergies  Allergen Reactions  . Penicillins Rash    Happened in childhood    REVIEW OF SYSTEMS: Skin:  No history of rash.  No history of abnormal moles. Infection:  No history of hepatitis or HIV.  No history of MRSA. Neurologic:  No history of stroke.  No history of seizure.  No history of headaches. Cardiac:  No history of  hypertension. No history of heart disease.  No history of prior cardiac catheterization.  No history of seeing a cardiologist. Pulmonary:  Does not smoke cigarettes.  No asthma or bronchitis.  No OSA/CPAP.  Endocrine:  No diabetes. No thyroid disease. Gastrointestinal:  See HPI Urologic:  No history of kidney stones.  No history of bladder infections. Musculoskeletal:  No history of joint or back disease. Hematologic:  No bleeding disorder.  No history of anemia.  Not anticoagulated. Psycho-social:  The patient is oriented.   The patient has no obvious psychologic or social impairment to understanding our conversation and plan.  SOCIAL and FAMILY HISTORY: Married.   His wife, "Gwinda Passe" Josejuan Hoaglin - MRN -  [id number]), is at the bedside.  She is a lap band patient of mine. He was laid off from [employer] (now a different name) about one month ago.  He did maintenance.    PHYSICAL EXAM: BP 133/76 mmHg  Pulse 78  Temp(Src) 98.2 F (36.8 C) (Oral)  Resp 18  SpO2 90%  General: WN WM who is alert.  He looks a little miserable.  HEENT: Normal. Pupils equal. Neck: Supple. No mass.  No thyroid mass. Lymph Nodes:  No supraclavicular or cervical nodes. Lungs: Clear to auscultation and symmetric breath sounds. Heart:  RRR. No murmur or rub.  Abdomen: Distended.  Quiet, but not tender.  Has the scars and markings from recent ventral hernia repair. Rectal: Not done. Extremities:  Good strength and ROM  in upper and lower extremities. Neurologic:  Grossly intact to motor and sensory function. Psychiatric: Has normal mood and affect. Behavior is normal.   DATA REVIEWED: Epic notes.  Alphonsa Overall, MD,  [facility] Surgery, [address]  [place], [place]    [place] Phone:  [phone] FAX:  [phone]

## 2015-06-17 NOTE — ED Notes (Signed)
Pt states he has been throwing up all day and is unable to give a urine sample because he is dehydrated.

## 2015-06-17 NOTE — ED Notes (Signed)
Pt given urinal, aware we need urine specimen

## 2015-06-18 ENCOUNTER — Inpatient Hospital Stay (HOSPITAL_COMMUNITY): Payer: 59

## 2015-06-18 ENCOUNTER — Encounter (HOSPITAL_COMMUNITY): Payer: Self-pay | Admitting: General Practice

## 2015-06-18 LAB — URINE MICROSCOPIC-ADD ON

## 2015-06-18 LAB — CBC
HCT: 42.6 % (ref 39.0–52.0)
HEMOGLOBIN: 13.5 g/dL (ref 13.0–17.0)
MCH: 25.6 pg — ABNORMAL LOW (ref 26.0–34.0)
MCHC: 31.7 g/dL (ref 30.0–36.0)
MCV: 80.7 fL (ref 78.0–100.0)
Platelets: 184 10*3/uL (ref 150–400)
RBC: 5.28 MIL/uL (ref 4.22–5.81)
RDW: 15.2 % (ref 11.5–15.5)
WBC: 10.5 10*3/uL (ref 4.0–10.5)

## 2015-06-18 LAB — URINALYSIS, ROUTINE W REFLEX MICROSCOPIC
GLUCOSE, UA: NEGATIVE mg/dL
Hgb urine dipstick: NEGATIVE
KETONES UR: NEGATIVE mg/dL
Leukocytes, UA: NEGATIVE
NITRITE: NEGATIVE
Protein, ur: 30 mg/dL — AB
SPECIFIC GRAVITY, URINE: 1.029 (ref 1.005–1.030)
Urobilinogen, UA: 1 mg/dL (ref 0.0–1.0)
pH: 5.5 (ref 5.0–8.0)

## 2015-06-18 LAB — LIPASE, BLOOD: LIPASE: 15 U/L — AB (ref 22–51)

## 2015-06-18 LAB — BASIC METABOLIC PANEL
Anion gap: 9 (ref 5–15)
BUN: 25 mg/dL — ABNORMAL HIGH (ref 6–20)
CO2: 24 mmol/L (ref 22–32)
Calcium: 8.5 mg/dL — ABNORMAL LOW (ref 8.9–10.3)
Chloride: 101 mmol/L (ref 101–111)
Creatinine, Ser: 0.97 mg/dL (ref 0.61–1.24)
GFR calc non Af Amer: >60 mL/min (ref >60)
GLUCOSE: 126 mg/dL — AB (ref 65–99)
Potassium: 4.2 mmol/L (ref 3.5–5.1)
SODIUM: 134 mmol/L — AB (ref 135–145)

## 2015-06-18 MED ORDER — CETYLPYRIDINIUM CHLORIDE 0.05 % MT LIQD
7.0000 mL | Freq: Two times a day (BID) | OROMUCOSAL | Status: DC
  Administered 2015-06-18 – 2015-06-20 (×6): 7 mL via OROMUCOSAL

## 2015-06-18 MED ORDER — ONDANSETRON HCL 4 MG/2ML IJ SOLN
4.0000 mg | Freq: Four times a day (QID) | INTRAMUSCULAR | Status: DC | PRN
  Administered 2015-06-18 (×2): 4 mg via INTRAVENOUS
  Filled 2015-06-18 (×2): qty 2

## 2015-06-18 MED ORDER — POTASSIUM CHLORIDE IN NACL 20-0.45 MEQ/L-% IV SOLN
INTRAVENOUS | Status: DC
  Administered 2015-06-18 (×2): via INTRAVENOUS
  Administered 2015-06-19 – 2015-06-20 (×3): 100 mL/h via INTRAVENOUS
  Administered 2015-06-20 – 2015-06-22 (×4): via INTRAVENOUS
  Filled 2015-06-18 (×14): qty 1000

## 2015-06-18 MED ORDER — MORPHINE SULFATE 2 MG/ML IJ SOLN
1.0000 mg | INTRAMUSCULAR | Status: DC | PRN
  Administered 2015-06-18 – 2015-06-21 (×4): 2 mg via INTRAVENOUS
  Filled 2015-06-18 (×6): qty 1

## 2015-06-18 MED ORDER — BISACODYL 10 MG RE SUPP
10.0000 mg | Freq: Every day | RECTAL | Status: DC | PRN
  Administered 2015-06-18 – 2015-06-20 (×3): 10 mg via RECTAL
  Filled 2015-06-18 (×3): qty 1

## 2015-06-18 MED ORDER — HEPARIN SODIUM (PORCINE) 5000 UNIT/ML IJ SOLN
5000.0000 [IU] | Freq: Three times a day (TID) | INTRAMUSCULAR | Status: DC
  Administered 2015-06-18 – 2015-06-23 (×15): 5000 [IU] via SUBCUTANEOUS
  Filled 2015-06-18 (×21): qty 1

## 2015-06-18 NOTE — Progress Notes (Signed)
Subjective: S/p VHR with ileus.  Passing a small amt of flatus.  No more vomiting.  Objective: Vital signs in last 24 hours: Temp:  [98.2 F (36.8 C)-99.1 F (37.3 C)] 99.1 F (37.3 C) 06/19/23 0625) Pulse Rate:  [74-86] 83 June 19, 2023 0625) Resp:  [18-20] 18 06-19-23 0625) BP: (131-139)/(65-80) 136/65 mmHg 06/19/23 0625) SpO2:  [90 %-97 %] 96 % 06/19/2023 0625) Weight:  [147.419 kg (325 lb)] 147.419 kg (325 lb) June 19, 2023 0115)   Intake/Output from previous day: 06/30 0701 - 2023/06/19 0700 In: 977.5 [I.V.:977.5] Out: 175 [Emesis/NG output:175] Intake/Output this shift:     General appearance: alert and cooperative GI: soft, appropriately tender, distended  Incision: clean  Lab Results:   Recent Labs  06/17/15 2212 19-Jun-2015 0410  WBC 12.0* 10.5  HGB 14.7 13.5  HCT 45.4 42.6  PLT 185 184   BMET  Recent Labs  06/17/15 2212 June 19, 2015 0410  NA 132* 134*  K 4.1 4.2  CL 98* 101  CO2 22 24  GLUCOSE 135* 126*  BUN 24* 25*  CREATININE 1.19 0.97  CALCIUM 9.1 8.5*   PT/INR No results for input(s): LABPROT, INR in the last 72 hours. ABG No results for input(s): PHART, HCO3 in the last 72 hours.  Invalid input(s): PCO2, PO2  MEDS, Scheduled . antiseptic oral rinse  7 mL Mouth Rinse BID  . heparin  5,000 Units Subcutaneous 3 times per day    Studies/Results: Dg Abd 2 Views  19-Jun-2015   CLINICAL DATA:  Small bowel obstruction.  Recent hernia surgery.  EXAM: ABDOMEN - 2 VIEW  COMPARISON:  June 17, 2015.  FINDINGS: Phleboliths are noted in the pelvis. Status post cholecystectomy. Continued presence of small bowel dilatation with air-fluid levels which is slightly improved compared to prior exam. This remains suspicious for distal small bowel obstruction or possibly postoperative ileus. No free air is seen to suggest pneumoperitoneum.  IMPRESSION: Continued presence of small bowel dilatation with air-fluid levels which is slightly improved compared to prior exam, but remain suspicious  for distal small bowel obstruction or possibly postoperative ileus. Continued radiographic follow-up is recommended.   Electronically Signed   By: Marijo Conception, M.D.   On: 06-19-2015 08:55   Dg Abd Acute W/chest  06/17/2015   CLINICAL DATA:  Abdominal pain  EXAM: DG ABDOMEN ACUTE W/ 1V CHEST  COMPARISON:  02/28/2015  FINDINGS: Cardiac shadow is mildly prominent. Increased left perihilar infiltrate is noted as well as some linear atelectasis in the right perihilar region. Elevation of left hemidiaphragm is seen.  The abdomen demonstrates multiple mildly dilated loops of small bowel with differential air-fluid levels consistent with obstructive process. Some gas is noted within the colon suggesting that this is partial in nature. Multiple splenic granulomas are seen. The osseous structures are within normal limits. No free air is noted.  IMPRESSION: Small bowel dilatation consistent with a small partial small bowel obstruction or small-bowel postoperative ileus. Correlation with physical exam is recommended.  Bilateral perihilar infiltrates.   Electronically Signed   By: Inez Catalina M.D.   On: 06/17/2015 22:53    Assessment: s/p  Patient Active Problem List   Diagnosis Date Noted  . Small bowel obstruction 06/17/2015  . Incisional hernia 06/15/2015  . Acute pancreatitis 08/27/2013  . Cholelithiasis 08/27/2013  . Dehydration 08/27/2013  . Hx of right-sided colectomy 08/27/2013    Post op ileus.  AXR looks mildly better.  Plan: Cont NPO  Ambulate Suppository for rectal stimulation K pad to help  with discomfort.   Cont IVF's NG if pt starts to vomit   LOS: 1 day     .Rosario Adie, [facility], [place]   06/18/2015 10:30 AM

## 2015-06-19 NOTE — Progress Notes (Signed)
Patient ID: Larry Mcclure, male   DOB: 1961-09-11, 54 y.o.   MRN: [id number] [facility] Surgery Progress Note:   * No surgery found *  Subjective: Mental status is clear.  Passed a little flatus after suppository Objective: Vital signs in last 24 hours: Temp:  [98 F (36.7 C)-99.7 F (37.6 C)] 99.6 F (37.6 C) (07/02 0551) Pulse Rate:  [82-88] 87 (07/02 0551) Resp:  [18-20] 18 (07/02 0551) BP: (109-162)/(60-87) 141/74 mmHg (07/02 0551) SpO2:  [94 %-99 %] 94 % (07/02 0551)  Intake/Output from previous day: 07/01 0701 - 07/02 0700 In: 2410 [I.V.:2410] Out: 1150 [Urine:1150] Intake/Output this shift:    Physical Exam: Work of breathing is not labored.  Incisions covered with liquiban  Lab Results:  Results for orders placed or performed during the hospital encounter of 06/17/15 (from the past 48 hour(s))  CBC with Differential     Status: Abnormal   Collection Time: 06/17/15 10:12 PM  Result Value Ref Range   WBC 12.0 (H) 4.0 - 10.5 K/uL   RBC 5.73 4.22 - 5.81 MIL/uL   Hemoglobin 14.7 13.0 - 17.0 g/dL   HCT 45.4 39.0 - 52.0 %   MCV 79.2 78.0 - 100.0 fL   MCH 25.7 (L) 26.0 - 34.0 pg   MCHC 32.4 30.0 - 36.0 g/dL   RDW 14.8 11.5 - 15.5 %   Platelets 185 150 - 400 K/uL   Neutrophils Relative % 78 (H) 43 - 77 %   Neutro Abs 9.3 (H) 1.7 - 7.7 K/uL   Lymphocytes Relative 8 (L) 12 - 46 %   Lymphs Abs 1.0 0.7 - 4.0 K/uL   Monocytes Relative 14 (H) 3 - 12 %   Monocytes Absolute 1.7 (H) 0.1 - 1.0 K/uL   Eosinophils Relative 0 0 - 5 %   Eosinophils Absolute 0.0 0.0 - 0.7 K/uL   Basophils Relative 0 0 - 1 %   Basophils Absolute 0.0 0.0 - 0.1 K/uL  Comprehensive metabolic panel     Status: Abnormal   Collection Time: 06/17/15 10:12 PM  Result Value Ref Range   Sodium 132 (L) 135 - 145 mmol/L   Potassium 4.1 3.5 - 5.1 mmol/L   Chloride 98 (L) 101 - 111 mmol/L   CO2 22 22 - 32 mmol/L   Glucose, Bld 135 (H) 65 - 99 mg/dL   BUN 24 (H) 6 - 20 mg/dL   Creatinine, Ser 1.19 0.61 -  1.24 mg/dL   Calcium 9.1 8.9 - 10.3 mg/dL   Total Protein 8.3 (H) 6.5 - 8.1 g/dL   Albumin 3.8 3.5 - 5.0 g/dL   AST 32 15 - 41 U/L   ALT 21 17 - 63 U/L   Alkaline Phosphatase 70 38 - 126 U/L   Total Bilirubin 2.5 (H) 0.3 - 1.2 mg/dL   GFR calc non Af Amer >60 >60 mL/min   GFR calc Af Amer >60 >60 mL/min    Comment: (NOTE) The eGFR has been calculated using the CKD EPI equation. This calculation has not been validated in all clinical situations. eGFR's persistently <60 mL/min signify possible Chronic Kidney Disease.    Anion gap 12 5 - 15  Urinalysis, Routine w reflex microscopic (not at [facility])     Status: Abnormal   Collection Time: 06/18/15  1:03 AM  Result Value Ref Range   Color, Urine AMBER (A) YELLOW    Comment: BIOCHEMICALS MAY BE AFFECTED BY COLOR   APPearance CLOUDY (A) CLEAR  Specific Gravity, Urine 1.029 1.005 - 1.030   pH 5.5 5.0 - 8.0   Glucose, UA NEGATIVE NEGATIVE mg/dL   Hgb urine dipstick NEGATIVE NEGATIVE   Bilirubin Urine SMALL (A) NEGATIVE   Ketones, ur NEGATIVE NEGATIVE mg/dL   Protein, ur 30 (A) NEGATIVE mg/dL   Urobilinogen, UA 1.0 0.0 - 1.0 mg/dL   Nitrite NEGATIVE NEGATIVE   Leukocytes, UA NEGATIVE NEGATIVE  Urine microscopic-add on     Status: Abnormal   Collection Time: 06/18/15  1:03 AM  Result Value Ref Range   WBC, UA 3-6 <3 WBC/hpf   Bacteria, UA FEW (A) RARE   Casts HYALINE CASTS (A) NEGATIVE   Urine-Other MUCOUS PRESENT   Basic metabolic panel     Status: Abnormal   Collection Time: 06/18/15  4:10 AM  Result Value Ref Range   Sodium 134 (L) 135 - 145 mmol/L   Potassium 4.2 3.5 - 5.1 mmol/L   Chloride 101 101 - 111 mmol/L   CO2 24 22 - 32 mmol/L   Glucose, Bld 126 (H) 65 - 99 mg/dL   BUN 25 (H) 6 - 20 mg/dL   Creatinine, Ser 0.97 0.61 - 1.24 mg/dL   Calcium 8.5 (L) 8.9 - 10.3 mg/dL   GFR calc non Af Amer >60 >60 mL/min   GFR calc Af Amer >60 >60 mL/min    Comment: (NOTE) The eGFR has been calculated using the CKD EPI equation. This  calculation has not been validated in all clinical situations. eGFR's persistently <60 mL/min signify possible Chronic Kidney Disease.    Anion gap 9 5 - 15  CBC     Status: Abnormal   Collection Time: 06/18/15  4:10 AM  Result Value Ref Range   WBC 10.5 4.0 - 10.5 K/uL   RBC 5.28 4.22 - 5.81 MIL/uL   Hemoglobin 13.5 13.0 - 17.0 g/dL   HCT 42.6 39.0 - 52.0 %   MCV 80.7 78.0 - 100.0 fL   MCH 25.6 (L) 26.0 - 34.0 pg   MCHC 31.7 30.0 - 36.0 g/dL   RDW 15.2 11.5 - 15.5 %   Platelets 184 150 - 400 K/uL  Lipase, blood     Status: Abnormal   Collection Time: 06/18/15  4:10 AM  Result Value Ref Range   Lipase 15 (L) 22 - 51 U/L    Radiology/Results: Dg Abd 2 Views  06/18/2015   CLINICAL DATA:  Small bowel obstruction.  Recent hernia surgery.  EXAM: ABDOMEN - 2 VIEW  COMPARISON:  June 17, 2015.  FINDINGS: Phleboliths are noted in the pelvis. Status post cholecystectomy. Continued presence of small bowel dilatation with air-fluid levels which is slightly improved compared to prior exam. This remains suspicious for distal small bowel obstruction or possibly postoperative ileus. No free air is seen to suggest pneumoperitoneum.  IMPRESSION: Continued presence of small bowel dilatation with air-fluid levels which is slightly improved compared to prior exam, but remain suspicious for distal small bowel obstruction or possibly postoperative ileus. Continued radiographic follow-up is recommended.   Electronically Signed   By: Marijo Conception, M.D.   On: 06/18/2015 08:55   Dg Abd Acute W/chest  06/17/2015   CLINICAL DATA:  Abdominal pain  EXAM: DG ABDOMEN ACUTE W/ 1V CHEST  COMPARISON:  02/28/2015  FINDINGS: Cardiac shadow is mildly prominent. Increased left perihilar infiltrate is noted as well as some linear atelectasis in the right perihilar region. Elevation of left hemidiaphragm is seen.  The abdomen demonstrates multiple mildly dilated  loops of small bowel with differential air-fluid levels consistent  with obstructive process. Some gas is noted within the colon suggesting that this is partial in nature. Multiple splenic granulomas are seen. The osseous structures are within normal limits. No free air is noted.  IMPRESSION: Small bowel dilatation consistent with a small partial small bowel obstruction or small-bowel postoperative ileus. Correlation with physical exam is recommended.  Bilateral perihilar infiltrates.   Electronically Signed   By: Inez Catalina M.D.   On: 06/17/2015 22:53    Anti-infectives: Anti-infectives    None      Assessment/Plan: Problem List: Patient Active Problem List   Diagnosis Date Noted  . Small bowel obstruction 06/17/2015  . Incisional hernia 06/15/2015  . Acute pancreatitis 08/27/2013  . Cholelithiasis 08/27/2013  . Dehydration 08/27/2013  . Hx of right-sided colectomy 08/27/2013    Ileus after lap ventral hernia * No surgery found *    LOS: 2 days   Matt B. Hassell Done, MD, [facility] [phone] beeper [phone]  06/19/2015 8:55 AM

## 2015-06-20 ENCOUNTER — Inpatient Hospital Stay (HOSPITAL_COMMUNITY): Payer: 59

## 2015-06-20 NOTE — Progress Notes (Signed)
Patient ID: Larry Mcclure, male   DOB: 1961/06/11, 54 y.o.   MRN: [id number] [facility] Surgery Progress Note:   * No surgery found *  Subjective: Mental status is clear.  Objective: Vital signs in last 24 hours: Temp:  [98.4 F (36.9 C)-100.8 F (38.2 C)] 98.5 F (36.9 C) (07/03 0600) Pulse Rate:  [76-87] 76 (07/03 0600) Resp:  [18] 18 (07/03 0600) BP: (117-148)/(53-79) 117/56 mmHg (07/03 0600) SpO2:  [95 %-96 %] 95 % (07/03 0600)  Intake/Output from previous day: 07/02 0701 - 07/03 0700 In: 2400 [I.V.:2400] Out: 1101 [Urine:1100; Stool:1] Intake/Output this shift: Total I/O In: -  Out: 250 [Urine:250]  Physical Exam: Work of breathing is normal.  Abdomen is less distended and less tender.  Had large bm with gas this am.    Lab Results:  No results found for this or any previous visit (from the past 48 hour(s)).  Radiology/Results: Dg Abd 2 Views  06/20/2015   CLINICAL DATA:  Follow-up ileus. Hernia surgery last Tuesday. Nausea and vomiting. Constipation.  EXAM: ABDOMEN - 2 VIEW  COMPARISON:  06/18/2015.  FINDINGS: Diffuse gaseous distension of bowel is present. This involves both small and large bowel. Air-fluid levels are present within small bowel.  Cholecystectomy clips are present in the right upper quadrant.  Likely granulomatous calcifications are present in the RIGHT upper quadrant. No free air.  IMPRESSION: Diffuse gaseous distention of large in small bowel with air-fluid levels compatible with ileus in the postoperative setting.   Electronically Signed   By: Dereck Ligas M.D.   On: 06/20/2015 09:44    Anti-infectives: Anti-infectives    None      Assessment/Plan: Problem List: Patient Active Problem List   Diagnosis Date Noted  . Small bowel obstruction 06/17/2015  . Incisional hernia 06/15/2015  . Acute pancreatitis 08/27/2013  . Cholelithiasis 08/27/2013  . Dehydration 08/27/2013  . Hx of right-sided colectomy 08/27/2013    Xray still shows a  fair amount of large and small bowel gas.  Resolving ileus.  Getting better.  May start PO in AM.    * No surgery found *    LOS: 3 days   Matt B. Hassell Done, MD, [facility] [phone] beeper [phone]  06/20/2015 9:59 AM

## 2015-06-20 NOTE — Progress Notes (Signed)
Pt has expiratory wheezing. Using incentive spirometer.  Temp at 2200 100.8, 98.9.  Will continue to monitor.

## 2015-06-21 LAB — CBC WITH DIFFERENTIAL/PLATELET
Basophils Absolute: 0 10*3/uL (ref 0.0–0.1)
Basophils Relative: 0 % (ref 0–1)
EOS ABS: 0.3 10*3/uL (ref 0.0–0.7)
EOS PCT: 5 % (ref 0–5)
HCT: 37.7 % — ABNORMAL LOW (ref 39.0–52.0)
Hemoglobin: 12.3 g/dL — ABNORMAL LOW (ref 13.0–17.0)
LYMPHS ABS: 1.4 10*3/uL (ref 0.7–4.0)
LYMPHS PCT: 24 % (ref 12–46)
MCH: 26.3 pg (ref 26.0–34.0)
MCHC: 32.6 g/dL (ref 30.0–36.0)
MCV: 80.7 fL (ref 78.0–100.0)
MONO ABS: 0.7 10*3/uL (ref 0.1–1.0)
Monocytes Relative: 12 % (ref 3–12)
NEUTROS PCT: 59 % (ref 43–77)
Neutro Abs: 3.4 10*3/uL (ref 1.7–7.7)
Platelets: 180 10*3/uL (ref 150–400)
RBC: 4.67 MIL/uL (ref 4.22–5.81)
RDW: 15 % (ref 11.5–15.5)
WBC: 5.8 10*3/uL (ref 4.0–10.5)

## 2015-06-21 NOTE — Progress Notes (Signed)
Patient ID: Larry Mcclure, male   DOB: 1961-09-26, 54 y.o.   MRN: [id number] [facility] Progress Note:   * No surgery found *  Subjective: Mental status is clear.  Vomited some last evening.   Objective: Vital signs in last 24 hours: Temp:  [98.2 F (36.8 C)-98.5 F (36.9 C)] 98.2 F (36.8 C) (07/04 0557) Pulse Rate:  [79-80] 79 (07/04 0557) Resp:  [18] 18 (07/04 0557) BP: (128-141)/(66-74) 128/67 mmHg (07/04 0557) SpO2:  [96 %-99 %] 96 % (07/04 0557)  Intake/Output from previous day: 07/03 0701 - 07/04 0700 In: 2400 [I.V.:2400] Out: 1950 [Urine:1950] Intake/Output this shift:    Physical Exam: Work of breathing is normal;  Has some wheezing that was present yesterday;  Abdomen still with distention.    Lab Results:  Results for orders placed or performed during the hospital encounter of 06/17/15 (from the past 48 hour(s))  CBC with Differential/Platelet     Status: Abnormal   Collection Time: 06/21/15  4:15 AM  Result Value Ref Range   WBC 5.8 4.0 - 10.5 K/uL   RBC 4.67 4.22 - 5.81 MIL/uL   Hemoglobin 12.3 (L) 13.0 - 17.0 g/dL   HCT 37.7 (L) 39.0 - 52.0 %   MCV 80.7 78.0 - 100.0 fL   MCH 26.3 26.0 - 34.0 pg   MCHC 32.6 30.0 - 36.0 g/dL   RDW 15.0 11.5 - 15.5 %   Platelets 180 150 - 400 K/uL   Neutrophils Relative % 59 43 - 77 %   Neutro Abs 3.4 1.7 - 7.7 K/uL   Lymphocytes Relative 24 12 - 46 %   Lymphs Abs 1.4 0.7 - 4.0 K/uL   Monocytes Relative 12 3 - 12 %   Monocytes Absolute 0.7 0.1 - 1.0 K/uL   Eosinophils Relative 5 0 - 5 %   Eosinophils Absolute 0.3 0.0 - 0.7 K/uL   Basophils Relative 0 0 - 1 %   Basophils Absolute 0.0 0.0 - 0.1 K/uL    Radiology/Results: Dg Abd 2 Views  06/20/2015   CLINICAL DATA:  Follow-up ileus. Hernia surgery last Tuesday. Nausea and vomiting. Constipation.  EXAM: ABDOMEN - 2 VIEW  COMPARISON:  06/18/2015.  FINDINGS: Diffuse gaseous distension of bowel is present. This involves both small and large bowel. Air-fluid levels  are present within small bowel.  Cholecystectomy clips are present in the right upper quadrant.  Likely granulomatous calcifications are present in the RIGHT upper quadrant. No free air.  IMPRESSION: Diffuse gaseous distention of large in small bowel with air-fluid levels compatible with ileus in the postoperative setting.   Electronically Signed   By: Dereck Ligas M.D.   On: 06/20/2015 09:44    Anti-infectives: Anti-infectives    None      Assessment/Plan: Problem List: Patient Active Problem List   Diagnosis Date Noted  . Small bowel obstruction 06/17/2015  . Incisional hernia 06/15/2015  . Acute pancreatitis 08/27/2013  . Cholelithiasis 08/27/2013  . Dehydration 08/27/2013  . Hx of right-sided colectomy 08/27/2013    X ray consistent with ileus.  Passing some flatus.  Will offer some clears.  Explained to patient.   * No surgery found *    LOS: 4 days   Matt B. Hassell Done, MD, [facility] [phone] beeper [phone]  06/21/2015 8:10 AM

## 2015-06-22 MED ORDER — SODIUM CHLORIDE 0.9 % IJ SOLN: 3.0000 mL | INTRAMUSCULAR | Status: DC | PRN

## 2015-06-22 MED ORDER — HYDROCODONE-ACETAMINOPHEN 5-325 MG PO TABS: 1.0000 | ORAL_TABLET | ORAL | Status: DC | PRN

## 2015-06-22 MED ORDER — SODIUM CHLORIDE 0.9 % IJ SOLN
3.0000 mL | Freq: Two times a day (BID) | INTRAMUSCULAR | Status: DC
  Administered 2015-06-22 (×2): 3 mL via INTRAVENOUS

## 2015-06-22 NOTE — Progress Notes (Signed)
Patient ID: Larry Mcclure, male   DOB: 26-Jul-1961, 54 y.o.   MRN: [id number]     [place]      [address] [place], [place] [address]    Phone: [phone] FAX: [phone]     Subjective: Multiple BMs. Voiding.  Ambulating. no n/v.   Objective:  Vital signs:  Filed Vitals:   06/21/15 0557 06/21/15 1402 06/21/15 2200 06/22/15 0600  BP: 128/67 128/76 128/67 116/68  Pulse: 79 71 71 67  Temp: 98.2 F (36.8 C) 98.2 F (36.8 C) 98.4 F (36.9 C) 98.1 F (36.7 C)  TempSrc: Oral Oral Oral Oral  Resp: 18 18 18 18   Height:      Weight:      SpO2: 96% 99% 96% 95%    Last BM Date: 06/21/15  Intake/Output   Yesterday:  07/04 0701 - 07/05 0700 In: 2691.7 [P.O.:300; I.V.:2391.7] Out: 3025 [Urine:3025] This shift: I/O last 3 completed shifts: In: 3891.7 [P.O.:300; I.V.:3591.7] Out: 4025 [Urine:4025]    Physical Exam: General: Pt awake/alert/oriented x4 in no acute distress Chest: cta.  No chest wall pain w good excursion CV:  Pulses intact.  Regular rhythm MS: Normal AROM mjr joints.  No obvious deformity Abdomen: Soft.  Nondistended.   Mildly tender at incisions only.  Incisions are c/d/i. No evidence of peritonitis.  No incarcerated hernias. Ext: No mjr edema.  No cyanosis Skin: No petechiae / purpura   Problem List:   Active Problems:   Small bowel obstruction    Results:   Labs: Results for orders placed or performed during the hospital encounter of 06/17/15 (from the past 48 hour(s))  CBC with Differential/Platelet     Status: Abnormal   Collection Time: 06/21/15  4:15 AM  Result Value Ref Range   WBC 5.8 4.0 - 10.5 K/uL   RBC 4.67 4.22 - 5.81 MIL/uL   Hemoglobin 12.3 (L) 13.0 - 17.0 g/dL   HCT 37.7 (L) 39.0 - 52.0 %   MCV 80.7 78.0 - 100.0 fL   MCH 26.3 26.0 - 34.0 pg   MCHC 32.6 30.0 - 36.0 g/dL   RDW 15.0 11.5 - 15.5 %   Platelets 180 150 - 400 K/uL   Neutrophils Relative % 59 43 - 77 %   Neutro  Abs 3.4 1.7 - 7.7 K/uL   Lymphocytes Relative 24 12 - 46 %   Lymphs Abs 1.4 0.7 - 4.0 K/uL   Monocytes Relative 12 3 - 12 %   Monocytes Absolute 0.7 0.1 - 1.0 K/uL   Eosinophils Relative 5 0 - 5 %   Eosinophils Absolute 0.3 0.0 - 0.7 K/uL   Basophils Relative 0 0 - 1 %   Basophils Absolute 0.0 0.0 - 0.1 K/uL    Imaging / Studies: No results found.  Medications / Allergies:  Scheduled Meds: . antiseptic oral rinse  7 mL Mouth Rinse BID  . heparin  5,000 Units Subcutaneous 3 times per day  . sodium chloride  3 mL Intravenous Q12H   Continuous Infusions:  PRN Meds:.bisacodyl, morphine injection, ondansetron, sodium chloride  Antibiotics: Anti-infectives    None        Assessment/Plan S/p laparoscopic incisional hernia repair 06/15/15 Post op ileus-resolved, mobilizing, pain controlled VTE prophylaxis-SCD/heparin  FEN-fulls, advance as tolerated, DC IVF, add PO pain meds Dispo-anticipate tomorrow  Erby Pian, ANP-BC [facility] Pager [phone]([id number])   06/22/2015 9:48 AM

## 2015-06-23 NOTE — Progress Notes (Signed)
Patient is alert and oriented, vital signs are stable, patient with bowel movements and passing flatus, patient is tolerating his diet without complaints of nausea or vomiting, patient to follow up with [facility] within 2 weeks Neta Mends RN 8:33 AM 06-23-2015

## 2015-06-23 NOTE — Progress Notes (Signed)
Patient ID: Larry Mcclure, male   DOB: 23-Feb-1961, 54 y.o.   MRN: [id number]  Doing well now Tolerating po Passing flatus and BM's abd soft  Plan: Discharge patient

## 2015-06-23 NOTE — Discharge Summary (Signed)
Physician Discharge Summary  Patient ID: Larry Mcclure MRN: [id number] DOB/AGE: 19-Nov-1961 54 y.o.  Admit date: 06/17/2015 Discharge date: 06/23/2015  Admission Diagnoses:  Discharge Diagnoses:  Active Problems:   Postoperative ileus   Discharged Condition: good  Hospital Course: admitted with post op ileus.  Took several days to resolve with bowel rest, IVF, etc.  Started passing flatus and having BM's.  Diet slowly advanced and tolerated.  Discharged home  Consults: None  Significant Diagnostic Studies:   Treatments: IV hydration  Discharge Exam: Blood pressure 123/71, pulse 74, temperature 99.2 F (37.3 C), temperature source Oral, resp. rate 18, height 6\' 4"  (1.93 m), weight 147.419 kg (325 lb), SpO2 96 %. General appearance: alert, cooperative and no distress GI: soft, non-tender; bowel sounds normal; no masses,  no organomegaly Incision/Wound:incisions healing well  Disposition: 01-Home or Self Care     Medication List    TAKE these medications        acetaminophen 500 MG tablet  Commonly known as:  TYLENOL  Take 500-1,000 mg by mouth every 6 (six) hours as needed for headache.     ADVIL PM 200-25 MG Caps  Generic drug:  Ibuprofen-Diphenhydramine HCl  Take 2 tablets by mouth daily as needed (pain).     HYDROcodone-acetaminophen 5-325 MG per tablet  Commonly known as:  NORCO  Take 1-2 tablets by mouth every 4 (four) hours as needed.     naproxen 500 MG tablet  Commonly known as:  NAPROSYN  Take 1 tablet (500 mg total) by mouth 2 (two) times daily with a meal.     naproxen 500 MG tablet  Commonly known as:  NAPROSYN  Take 1 tablet (500 mg total) by mouth 2 (two) times daily with a meal.           Follow-up Information    Follow up with [facility] A, MD. Schedule an appointment as soon as possible for a visit in 2 weeks.   Specialty:  General Surgery   Contact information:   [address] [place] [address] [phone]        Signed: Harl Bowie 06/23/2015, 7:46 AM

## 2015-06-23 NOTE — Discharge Instructions (Signed)
Call for return of nausea, vomiting  Continue to refrain from heavy lifting  May shower and soak in a tub/swim

## 2019-01-15 DIAGNOSIS — Z131 Encounter for screening for diabetes mellitus: Secondary | ICD-10-CM | POA: Diagnosis not present

## 2019-01-15 DIAGNOSIS — Z6841 Body Mass Index (BMI) 40.0 and over, adult: Secondary | ICD-10-CM | POA: Diagnosis not present

## 2019-01-15 DIAGNOSIS — Z1211 Encounter for screening for malignant neoplasm of colon: Secondary | ICD-10-CM | POA: Diagnosis not present

## 2019-01-15 DIAGNOSIS — R351 Nocturia: Secondary | ICD-10-CM | POA: Diagnosis not present

## 2019-01-15 DIAGNOSIS — Z0000 Encounter for general adult medical examination without abnormal findings: Secondary | ICD-10-CM | POA: Diagnosis not present

## 2019-01-15 DIAGNOSIS — Z13220 Encounter for screening for lipoid disorders: Secondary | ICD-10-CM | POA: Diagnosis not present

## 2019-01-15 DIAGNOSIS — Z125 Encounter for screening for malignant neoplasm of prostate: Secondary | ICD-10-CM | POA: Diagnosis not present

## 2019-05-21 DIAGNOSIS — Z86010 Personal history of colonic polyps: Secondary | ICD-10-CM | POA: Diagnosis not present

## 2019-05-21 DIAGNOSIS — Z980 Intestinal bypass and anastomosis status: Secondary | ICD-10-CM | POA: Diagnosis not present

## 2019-05-21 DIAGNOSIS — K640 First degree hemorrhoids: Secondary | ICD-10-CM | POA: Diagnosis not present

## 2019-05-21 DIAGNOSIS — K5730 Diverticulosis of large intestine without perforation or abscess without bleeding: Secondary | ICD-10-CM | POA: Diagnosis not present

## 2020-01-20 DIAGNOSIS — Z0000 Encounter for general adult medical examination without abnormal findings: Secondary | ICD-10-CM | POA: Diagnosis not present

## 2020-01-20 DIAGNOSIS — Z13220 Encounter for screening for lipoid disorders: Secondary | ICD-10-CM | POA: Diagnosis not present

## 2020-01-20 DIAGNOSIS — Z131 Encounter for screening for diabetes mellitus: Secondary | ICD-10-CM | POA: Diagnosis not present

## 2020-01-20 DIAGNOSIS — Z125 Encounter for screening for malignant neoplasm of prostate: Secondary | ICD-10-CM | POA: Diagnosis not present

## 2021-01-25 DIAGNOSIS — Z6841 Body Mass Index (BMI) 40.0 and over, adult: Secondary | ICD-10-CM | POA: Diagnosis not present

## 2021-01-25 DIAGNOSIS — Z86010 Personal history of colonic polyps: Secondary | ICD-10-CM | POA: Diagnosis not present

## 2021-01-25 DIAGNOSIS — Z0000 Encounter for general adult medical examination without abnormal findings: Secondary | ICD-10-CM | POA: Diagnosis not present

## 2021-01-25 DIAGNOSIS — Z125 Encounter for screening for malignant neoplasm of prostate: Secondary | ICD-10-CM | POA: Diagnosis not present

## 2021-01-25 DIAGNOSIS — Z13220 Encounter for screening for lipoid disorders: Secondary | ICD-10-CM | POA: Diagnosis not present

## 2021-04-18 DIAGNOSIS — R7989 Other specified abnormal findings of blood chemistry: Secondary | ICD-10-CM | POA: Diagnosis not present
